# Patient Record
Sex: Female | Born: 1945 | Race: White | Hispanic: No | Marital: Married | Smoking: Never smoker
Health system: Southern US, Community
[De-identification: ages and names within clinical notes are randomized; demographics above are authoritative.]

## PROBLEM LIST (undated history)

## (undated) DIAGNOSIS — R011 Cardiac murmur, unspecified: Secondary | ICD-10-CM

## (undated) DIAGNOSIS — I493 Ventricular premature depolarization: Secondary | ICD-10-CM

## (undated) DIAGNOSIS — N816 Rectocele: Secondary | ICD-10-CM

## (undated) DIAGNOSIS — I10 Essential (primary) hypertension: Secondary | ICD-10-CM

## (undated) DIAGNOSIS — C801 Malignant (primary) neoplasm, unspecified: Secondary | ICD-10-CM

## (undated) HISTORY — DX: Malignant (primary) neoplasm, unspecified: C80.1

## (undated) HISTORY — PX: BREAST EXCISIONAL BIOPSY: SUR124

## (undated) HISTORY — PX: HERNIA REPAIR: SHX51

## (undated) HISTORY — PX: KNEE ARTHROSCOPY: SUR90

## (undated) HISTORY — PX: SKIN CANCER EXCISION: SHX779

## (undated) HISTORY — DX: Cardiac murmur, unspecified: R01.1

## (undated) HISTORY — DX: Rectocele: N81.6

## (undated) HISTORY — DX: Essential (primary) hypertension: I10

## (undated) HISTORY — PX: POLYPECTOMY: SHX149

## (undated) HISTORY — PX: COLONOSCOPY: SHX174

## (undated) HISTORY — PX: BREAST BIOPSY: SHX20

---

## 2002-12-10 HISTORY — PX: COLONOSCOPY W/ BIOPSIES AND POLYPECTOMY: SHX1376

## 2008-02-12 ENCOUNTER — Ambulatory Visit: Payer: Self-pay | Admitting: Vascular Surgery

## 2009-02-03 ENCOUNTER — Ambulatory Visit: Payer: Self-pay | Admitting: Vascular Surgery

## 2010-01-19 ENCOUNTER — Ambulatory Visit: Payer: Self-pay | Admitting: Vascular Surgery

## 2010-02-08 ENCOUNTER — Telehealth: Payer: Self-pay | Admitting: Cardiology

## 2010-02-13 ENCOUNTER — Encounter: Payer: Self-pay | Admitting: Cardiology

## 2010-03-14 ENCOUNTER — Encounter: Payer: Self-pay | Admitting: Internal Medicine

## 2010-03-21 ENCOUNTER — Ambulatory Visit: Payer: Self-pay | Admitting: Cardiology

## 2010-03-21 ENCOUNTER — Encounter: Payer: Self-pay | Admitting: Cardiology

## 2010-03-21 ENCOUNTER — Ambulatory Visit: Payer: Self-pay

## 2010-03-21 ENCOUNTER — Ambulatory Visit (HOSPITAL_COMMUNITY): Admission: RE | Admit: 2010-03-21 | Discharge: 2010-03-21 | Payer: Self-pay | Admitting: Cardiology

## 2010-03-21 DIAGNOSIS — R011 Cardiac murmur, unspecified: Secondary | ICD-10-CM | POA: Insufficient documentation

## 2010-03-21 DIAGNOSIS — I10 Essential (primary) hypertension: Secondary | ICD-10-CM

## 2010-04-19 ENCOUNTER — Telehealth: Payer: Self-pay | Admitting: Internal Medicine

## 2010-05-23 ENCOUNTER — Ambulatory Visit: Payer: Self-pay | Admitting: Internal Medicine

## 2011-01-09 NOTE — Progress Notes (Signed)
Summary: wants to see dr Daleen Squibb  Phone Note From Other Clinic   Summary of Call: Per Darl Pikes pt is wife of Dr Ladene Artist and wants to see Dr Daleen Squibb. Mild Htn with SVT. [phone] x [phone]  Initial call taken by: Edman Circle,  February 08, 2010 2:03 PM  Follow-up for Phone Call        I will be happy to see. Would Reids be quicker in Follow-up by: Gaylord Shih, MD, [facility],  February 09, 2010 10:50 AM     Appended Document: wants to see dr Daleen Squibb I scheduled her for 3/22 here in [place]. LM with Darl Pikes with appt date and time.

## 2011-01-09 NOTE — Progress Notes (Signed)
Summary: new referral :dr.Reina Wilton is not acceptance new patient  Phone Note Call from Patient Call back at Home Phone [phone]   Caller: Patient Reason for Call: Talk to Nurse, Referral Details for Reason: dr. Cato Mulligan is not taken any new patient at this time. could he recommend someone else.  Summary of Call: Pt is a wife of Dr. Coral Else and was referred by Dr. Daleen Squibb, please advise if you can accept or recommend another internist. Initial call taken by: Lorne Skeens,  Apr 19, 2010 12:27 PM  Follow-up for Phone Call        LEFT MESSAGE AT DR Posie Lillibridge OFFICE  THAT DR WALL REFERRED PT PLEASE LET DR Ashante Snelling KNOW AND CALL WITH RESPONSE. Follow-up by: Scherrie Bateman, LPN,  Apr 19, 2010 1:57 PM  Additional Follow-up for Phone Call Additional follow up Details #1::        ok to see me---no urgency if needs more urgent attn dr. Kirtland Bouchard, dr burchette Additional Follow-up by: Birdie Sons MD,  Apr 20, 2010 4:53 PM    Additional Follow-up for Phone Call Additional follow up Details #2::    Left message to call back and schedule appt. Follow-up by: Trixie Dredge,  Apr 21, 2010 2:12 PM

## 2011-01-09 NOTE — Assessment & Plan Note (Signed)
Summary: np6/HTN w/SVT/jml   Visit Type:  new pt visit Referring Provider:  Dr. Tawanna Sat Primary Provider:  Jonelle Sports  CC:  pt is changing cardiololgist..previous cardio Dr. Hyacinth Meeker in [place] and [place]....pt said she thought she had some SVT back in 96...denies any cp or edema or sob.  History of Present Illness: Ruth Shields comes in today to assess whether or not she needs to continue treatment for hypertension.  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 Azalia Bilis, who comes today for the above reason. She was started on antihypertensive years ago when she was taking Imitrex for migraines. At that time she was  overweight and wasn't exercising on a regular basis.  Her blood pressure is always running about 110/70.  She denies any orthopnea, PND or peripheral edema. He's had no chest pain.  She is very conscious on diet with salt restriction. She exercises 5 days a week. She has never smoked. She is no longer on Imitrex.  She does not have a primary care physician. She does see Dr. Adalberto Ill for OB/GYN.  She had blood work drawn April 5 which shows a normal CBC, normal urinalysis with no protein, cholesterol 197 who is present 37 HDL 80 and LDL 110, normal electrolytes, normal vitamin D and normal TSH.  Preventive Screening-Counseling & Management  Alcohol-Tobacco     Smoking Status: never  Caffeine-Diet-Exercise     Does Patient Exercise: yes      Drug Use:  no.    Current Medications (verified): 1)  Diovan Hct 80-12.5 Mg Tabs (Valsartan-Hydrochlorothiazide) .Marland Kitchen.. 1 Tab Once Daily 2)  Prometrium 100 Mg Caps (Progesterone Micronized) .Marland Kitchen.. 1 Cap At Bedtime 3)  Vivelle-Dot 0.0375 Mg/24hr Pttw (Estradiol) .... Change Patch 2 X Weekly 4)  Vitamin D (Ergocalciferol) 50000 Unit Caps (Ergocalciferol) .Marland Kitchen.. 1 Tab Weekly  Allergies (verified): No Known Drug Allergies  Past History:  Family History: Last updated: 03/21/2010 Father: Alive and  well Mother: Alive and well Siblings: Alive and well  Social History: Last updated: 03/21/2010 Full Time Married  Tobacco Use - No.  Alcohol Use - yes Regular Exercise - yes Drug Use - no  Risk Factors: Exercise: yes (03/21/2010)  Risk Factors: Smoking Status: never (03/21/2010)  Past Medical History: HYPERTENSION (ICD-401.9)  Past Surgical History: Knee Arthroscopy Left Hernia repair breast bx x2 skin ca removal  Family History: Father: Alive and well Mother: Alive and well Siblings: Alive and well  Social History: Full Time Married  Tobacco Use - No.  Alcohol Use - yes Regular Exercise - yes Drug Use - no Smoking Status:  never Does Patient Exercise:  yes Drug Use:  no  Review of Systems       negative other than history of present illness  Vital Signs:  Patient profile:   65 year old female Height:      65 inches Weight:      138 pounds BMI:     23.05 Pulse rate:   71 / minute Pulse rhythm:   irregular BP sitting:   110 / 60  (left arm) Cuff size:   large  Vitals Entered By: Danielle Rankin, CMA (March 21, 2010 3:19 PM)  Physical Exam  General:  Well developed, well nourished, in no acute distress. Head:  normocephalic and atraumatic Eyes:  PERRLA/EOM intact; conjunctiva and lids normal. Mouth:  Teeth, gums and palate normal. Oral mucosa normal. Neck:  Neck supple, no JVD. No masses, thyromegaly or abnormal cervical nodes. Chest  Ruth Shields:  no deformities or breast masses noted Lungs:  Clear bilaterally to auscultation and percussion. Heart:  PMI nondisplaced, normal S1-S2, split S1 versus click, doubt gallop, regular rate and rhythm, soft systolic murmur at the apex. Abdomen:  Bowel sounds positive; abdomen soft and non-tender without masses, organomegaly, or hernias noted. No hepatosplenomegaly. Msk:  Back normal, normal gait. Muscle strength and tone normal. Pulses:  pulses normal in all 4 extremities Extremities:  No clubbing or  cyanosis. Neurologic:  Alert and oriented x 3. Skin:  Intact without lesions or rashes. Psych:  Normal affect.   Problems:  Medical Problems Added: 1)  Dx of Murmur  (ICD-785.2) 2)  Dx of Hypertension  (ICD-401.9)  EKG  Procedure date:  03/21/2010  Findings:      normal sinus rhythm with some PACs, poor R. progression and anterior precordium, probably normal  Impression & Recommendations:  Problem # 1:  HYPERTENSION (ICD-401.9) I suspect she does not need antihypertensive with her current healthy lifestyle not to mention weight loss. She is no longer need vasoconstrictors.  I advised her to stop her Diovan HCTZ to check her pressure several times a week at rest. She'll check in with a large adult cuff. Goal is 130 or less over 80 or less. If his above this she'll start back on her medication. We will also check a 2-D echocardiogram to assess for LVH and the possibility of prolapse. She would like to obtain a internist for her and have recommended Dr. Cato Mulligan. I'll see her back p.r.n. Her updated medication list for this problem includes:    Diovan Hct 80-12.5 Mg Tabs (Valsartan-hydrochlorothiazide) .Marland Kitchen... 1 tab once daily  Orders: EKG w/ Interpretation (93000) Echocardiogram (Echo)  Problem # 2:  MURMUR (ICD-785.2) Assessment: New This is most likely secondary to a flow murmur. We will rule out prolapse. Her updated medication list for this problem includes:    Diovan Hct 80-12.5 Mg Tabs (Valsartan-hydrochlorothiazide) .Marland Kitchen... 1 tab once daily Preliminary echocardiogram is normal. There is no LVH and mitral valve prolapse. Official report to follow and we will call patient.  Patient Instructions: 1)  Your physician recommends that you schedule a follow-up appointment in: AS NEEDED 2)  Your physician has requested that you have an echocardiogram.  Echocardiography is a painless test that uses sound waves to create images of your heart. It provides your doctor with information  about the size and shape of your heart and how well your heart's chambers and valves are working.  This procedure takes approximately one hour. There are no restrictions for this procedure. 3)  Your physician has recommended you make the following change in your medication: STOP  DIOVAN

## 2011-01-09 NOTE — Letter (Signed)
Summary: [facility]  [facility]   Imported By: Marylou Mccoy 05/17/2010 13:40:26  _____________________________________________________________________  External Attachment:    Type:   Image     Comment:   External Document

## 2011-01-09 NOTE — Assessment & Plan Note (Signed)
Summary: NEW PT TO EST OK PER MD//SLM   Vital Signs:  Patient profile:   65 year old female Menstrual status:  postmenopausal Height:      64.5 inches Weight:      139 pounds BMI:     23.58 Pulse rate:   64 / minute Pulse rhythm:   regular Resp:     12 per minute BP sitting:   92 / 60  (left arm) Cuff size:   regular  Vitals Entered By: Gladis Riffle, RN (May 23, 2010 10:20 AM) CC: to establish, referred by Dr Daleen Squibb Is Patient Diabetic? No     Menstrual Status postmenopausal Last PAP Result normal per pt   Primary Care Provider:  Jonelle Sports  CC:  to establish and referred by Dr Daleen Squibb.  History of Present Illness: SVT---no recurrence---reviewed Dr. Epifania Gore note  HTN---tolerating meds without difficulty (duration 4 years)    Preventive Screening-Counseling & Management  Alcohol-Tobacco     Smoking Status: never  Current Problems (verified): 1)  Murmur  (ICD-785.2) 2)  Hypertension  (ICD-401.9)  Current Medications (verified): 1)  Diovan Hct 80-12.5 Mg Tabs (Valsartan-Hydrochlorothiazide) .Marland Kitchen.. 1 Tab Once Daily 2)  Prometrium 100 Mg Caps (Progesterone Micronized) .Marland Kitchen.. 1 Cap At Bedtime 3)  Vivelle-Dot 0.0375 Mg/24hr Pttw (Estradiol) .... Change Patch 2 X Weekly 4)  Vitamin D (Ergocalciferol) 50000 Unit Caps (Ergocalciferol) .Marland Kitchen.. 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  Alcohol Use - yes one glass/night Regular Exercise - yes Drug Use - no  Physical Exam  General:  Well-developed,well-nourished,in no acute distress; alert,appropriate and cooperative throughout examination Head:  normocephalic and atraumatic.   Eyes:  pupils equal and pupils round.   Ears:  R ear normal and L ear normal.   Neck:  Neck supple, no JVD. No masses, thyromegaly or abnormal cervical nodes. Chest Wall:  no deformities or breast masses noted Lungs:  Clear bilaterally  to auscultation and percussion. Heart:  normal rate and regular rhythm.   Abdomen:  Bowel sounds positive; abdomen soft and non-tender without masses, organomegaly, or hernias noted. No hepatosplenomegaly. Msk:  No deformity or scoliosis noted of thoracic or lumbar spine.   Neurologic:  cranial nerves II-XII intact and gait normal.     Impression & Recommendations:  Problem # 1:  HYPERTENSION (ICD-401.9)  Her updated medication list for this problem includes:    Diovan Hct 80-12.5 Mg Tabs (Valsartan-hydrochlorothiazide) .Marland Kitchen... 1 tab once daily  BP today: 92/60 Prior BP: 110/60 (03/21/2010)  Complete Medication List: 1)  Diovan Hct 80-12.5 Mg Tabs (Valsartan-hydrochlorothiazide) .Marland Kitchen.. 1 tab once daily 2)  Prometrium 100 Mg Caps (Progesterone micronized) .Marland Kitchen.. 1 cap at bedtime 3)  Vivelle-dot 0.0375 Mg/24hr Pttw (Estradiol) .... Change patch 2 x weekly 4)  Vitamin D (ergocalciferol) 50000 Unit Caps (Ergocalciferol) .Marland Kitchen.. 1 tab weekly  Preventive Care Screening  Colonoscopy:    Date:  12/10/2001    Next Due:  12/2011    Results:  small polyp per pt   Mammogram:    Date:  12/10/2009    Next Due:  06/2010    Results:  normal per pt   Pap Smear:    Date:  03/10/2010    Next Due:  03/2011    Results:  normal per pt   Last Tetanus Booster:    Date:  12/10/2008    Results:  Historical   Prescriptions: DIOVAN HCT 80-12.5  MG TABS (VALSARTAN-HYDROCHLOROTHIAZIDE) 1 tab once daily  #90 x 3   Entered and Authorized by:   Birdie Sons MD   Signed by:   Birdie Sons MD on 05/23/2010   Method used:   Faxed to ...       [facility])             , [place]         Ph: [phone]       Fax: [phone]   RxID:   [id number]    Immunization History:  Tetanus/Td Immunization History:    Tetanus/Td:  historical (12/10/2008)    Preventive Care Screening  Colonoscopy:    Date:  12/10/2001    Next Due:  12/2011    Results:  small polyp per pt   Mammogram:     Date:  12/10/2009    Next Due:  06/2010    Results:  normal per pt   Pap Smear:    Date:  03/10/2010    Next Due:  03/2011    Results:  normal per pt   Last Tetanus Booster:    Date:  12/10/2008    Results:  Historical

## 2011-01-11 ENCOUNTER — Ambulatory Visit (INDEPENDENT_AMBULATORY_CARE_PROVIDER_SITE_OTHER)

## 2011-01-11 ENCOUNTER — Ambulatory Visit: Admit: 2011-01-11 | Payer: Self-pay | Admitting: Vascular Surgery

## 2011-01-11 DIAGNOSIS — I83893 Varicose veins of bilateral lower extremities with other complications: Secondary | ICD-10-CM

## 2011-05-16 ENCOUNTER — Other Ambulatory Visit: Payer: Self-pay | Admitting: Internal Medicine

## 2011-05-16 DIAGNOSIS — I10 Essential (primary) hypertension: Secondary | ICD-10-CM

## 2011-06-06 ENCOUNTER — Encounter: Admitting: Internal Medicine

## 2011-07-11 ENCOUNTER — Encounter: Payer: Self-pay | Admitting: Internal Medicine

## 2011-07-24 ENCOUNTER — Encounter: Admitting: Internal Medicine

## 2011-08-21 ENCOUNTER — Other Ambulatory Visit: Payer: Self-pay | Admitting: Internal Medicine

## 2011-11-17 ENCOUNTER — Other Ambulatory Visit: Payer: Self-pay | Admitting: Internal Medicine

## 2011-11-30 ENCOUNTER — Other Ambulatory Visit: Payer: Self-pay | Admitting: Internal Medicine

## 2011-12-11 LAB — HM PAP SMEAR: HM Pap smear: NORMAL

## 2011-12-12 ENCOUNTER — Telehealth: Payer: Self-pay | Admitting: Internal Medicine

## 2011-12-12 NOTE — Telephone Encounter (Signed)
Opened in error

## 2012-01-09 ENCOUNTER — Encounter: Payer: Self-pay | Admitting: Internal Medicine

## 2012-01-09 ENCOUNTER — Ambulatory Visit (INDEPENDENT_AMBULATORY_CARE_PROVIDER_SITE_OTHER): Admitting: Internal Medicine

## 2012-01-09 DIAGNOSIS — R011 Cardiac murmur, unspecified: Secondary | ICD-10-CM

## 2012-01-09 DIAGNOSIS — Z0000 Encounter for general adult medical examination without abnormal findings: Secondary | ICD-10-CM

## 2012-01-09 DIAGNOSIS — I10 Essential (primary) hypertension: Secondary | ICD-10-CM

## 2012-01-09 MED ORDER — VALSARTAN-HYDROCHLOROTHIAZIDE 80-12.5 MG PO TABS: 1.0000 | ORAL_TABLET | Freq: Every day | ORAL | Status: DC

## 2012-01-09 NOTE — Progress Notes (Signed)
Patient ID: Ruth Shields, female   DOB: 07/09/1946, 66 y.o.   MRN: [id number] cpx  Past Medical History  Diagnosis Date  . Hypertension     History   Social History  . Marital Status: Married    Spouse Name: N/A    Number of Children: N/A  . Years of Education: N/A   Occupational History  . Not on file.   Social History Main Topics  . Smoking status: Never Smoker   . Smokeless tobacco: Not on file  . Alcohol Use: 0.5 oz/week    1 drink(s) per week  . Drug Use: No  . Sexually Active: Not on file   Other Topics Concern  . Not on file   Social History Narrative  . No narrative on file    Past Surgical History  Procedure Date  . Knee arthroscopy   . Hernia repair     left  . Breast biopsy     x2  . Skin cancer excision     Family History  Problem Relation Age of Onset  . Atrial fibrillation Mother   . Cancer Father     lymphoma  . Hypertension Father     No Known Allergies  Current Outpatient Prescriptions on File Prior to Visit  Medication Sig Dispense Refill  . DIOVAN HCT 80-12.5 MG per tablet TAKE 1 TABLET ONCE DAILY  90 tablet  0     patient denies chest pain, shortness of breath, orthopnea. Denies lower extremity edema, abdominal pain, change in appetite, change in bowel movements. Patient denies rashes, musculoskeletal complaints. No other specific complaints in a complete review of systems.   BP 122/74  Pulse 72  Temp(Src) 98.2 F (36.8 C) (Oral)  Ht 5' 4.25" (1.632 m)  Wt 142 lb (64.411 kg)  BMI 24.19 kg/m2  Well-developed well-nourished female in no acute distress. HEENT exam atraumatic, normocephalic, extraocular muscles are intact. Neck is supple. No jugular venous distention no thyromegaly. Chest clear to auscultation without increased work of breathing. Cardiac exam S1 and S2 are regular. Abdominal exam active bowel sounds, soft, nontender. Extremities no edema. Neurologic exam she is alert without any motor sensory deficits. Gait is  normal.  A/P: Well visit--health Maint utd.

## 2012-01-09 NOTE — Patient Instructions (Signed)
Sign medical release form---colonoscopy

## 2012-01-11 ENCOUNTER — Other Ambulatory Visit: Payer: Self-pay | Admitting: Internal Medicine

## 2012-01-11 DIAGNOSIS — Z0000 Encounter for general adult medical examination without abnormal findings: Secondary | ICD-10-CM

## 2012-03-06 ENCOUNTER — Encounter: Payer: Self-pay | Admitting: Gastroenterology

## 2012-04-17 ENCOUNTER — Encounter

## 2012-04-24 ENCOUNTER — Ambulatory Visit (AMBULATORY_SURGERY_CENTER): Admitting: *Deleted

## 2012-04-24 VITALS — Ht 64.75 in | Wt 143.4 lb

## 2012-04-24 DIAGNOSIS — Z1211 Encounter for screening for malignant neoplasm of colon: Secondary | ICD-10-CM

## 2012-04-24 MED ORDER — PEG-KCL-NACL-NASULF-NA ASC-C 100 G PO SOLR: ORAL | Status: DC

## 2012-05-02 ENCOUNTER — Other Ambulatory Visit: Admitting: Gastroenterology

## 2012-05-06 ENCOUNTER — Telehealth: Payer: Self-pay | Admitting: Gastroenterology

## 2012-05-06 NOTE — Telephone Encounter (Signed)
Pt's pharmacy filled MoviPrep as PEG 3350 prep.  I talked to pharmacy and pt can bring prep back and get MoviPrep as prescribed.  Pt notified.  Ezra Sites

## 2012-05-09 ENCOUNTER — Encounter: Payer: Self-pay | Admitting: Gastroenterology

## 2012-05-09 ENCOUNTER — Ambulatory Visit (AMBULATORY_SURGERY_CENTER): Admitting: Gastroenterology

## 2012-05-09 VITALS — BP 114/75 | HR 66 | Temp 96.1°F | Resp 19 | Ht 64.0 in | Wt 143.0 lb

## 2012-05-09 DIAGNOSIS — D126 Benign neoplasm of colon, unspecified: Secondary | ICD-10-CM

## 2012-05-09 DIAGNOSIS — Z1211 Encounter for screening for malignant neoplasm of colon: Secondary | ICD-10-CM

## 2012-05-09 MED ORDER — SODIUM CHLORIDE 0.9 % IV SOLN: 500.0000 mL | INTRAVENOUS | Status: DC

## 2012-05-09 NOTE — Progress Notes (Signed)
Patient did not experience any of the following events: a burn prior to discharge; a fall within the facility; wrong site/side/patient/procedure/implant event; or a hospital transfer or hospital admission upon discharge from the facility. (G8907) Patient did not have preoperative order for IV antibiotic SSI prophylaxis. (G8918)  

## 2012-05-09 NOTE — Op Note (Signed)
[facility] [address] [place], [place]  [address]  COLONOSCOPY PROCEDURE REPORT PATIENT:  Ruth Shields, Ruth Shields  MR#:  [id number] BIRTHDATE:  Oct 15, 1946, 65 yrs. old  GENDER:  female ENDOSCOPIST:  Judie Petit T. Russella Dar, MD, [facility]  PROCEDURE DATE:  05/09/2012 PROCEDURE:  Colonoscopy with biopsy and snare polypectomy ASA CLASS:  Class II INDICATIONS:  1) Routine Risk Screening MEDICATIONS:   Fentanyl 100 mcg IV, Versed 10 mg IV, Benadryl 12.5 mg IV, These medications were titrated to patient response per physician's verbal order DESCRIPTION OF PROCEDURE:   After the risks benefits and alternatives of the procedure were thoroughly explained, informed consent was obtained.  Digital rectal exam was performed and revealed no abnormalities.   The LB [id number] endoscope was introduced through the anus and advanced to the cecum, which was identified by both the appendix and ileocecal valve, with a tortuous colon.   The quality of the prep was good, using MoviPrep.  The instrument was then slowly withdrawn as the colon was fully examined. <<PROCEDUREIMAGES>> FINDINGS:  A sessile polyp was found in the proximal transverse colon. It was 4 mm in size. The polyp was removed using cold biopsy forceps.  A sessile polyp was found in the sigmoid colon. It was 5 mm in size. Polyp was snared without cautery. Retrieval was successful. 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 Otherwise normal colonoscopy without other polyps, masses, vascular ectasias, or inflammatory changes.   Retroflexed views in the rectum revealed no abnormalities.  The time to cecum =  5.75 minutes. The scope was then withdrawn (time =  12  min) from the patient and the procedure completed.  COMPLICATIONS:  None  ENDOSCOPIC IMPRESSION: 1) 4 mm sessile polyp in the proximal transverse colon 2) 5 mm sessile polyp in the sigmoid colon 3) Melanosis throughout the colon  RECOMMENDATIONS: 1) Await pathology  results 2) Repeat Colonoscopy in 5 years if polyp is adenomatous, otherwise 10 years for routine screening.  Venita Lick. Russella Dar, MD, [facility]  n. eSIGNED:   Venita Lick. Christyann Manolis at 05/09/2012 10:03 AM  Wendee Beavers, [phone]

## 2012-05-09 NOTE — Patient Instructions (Signed)
YOU HAD AN ENDOSCOPIC PROCEDURE TODAY AT THE [facility]: Refer to the procedure report that was given to you for any specific questions about what was found during the examination.  If the procedure report does not answer your questions, please call your gastroenterologist to clarify.  If you requested that your care partner not be given the details of your procedure findings, then the procedure report has been included in a sealed envelope for you to review at your convenience later.  YOU SHOULD EXPECT: Some feelings of bloating in the abdomen. Passage of more gas than usual.  Walking can help get rid of the air that was put into your GI tract during the procedure and reduce the bloating. If you had a lower endoscopy (such as a colonoscopy or flexible sigmoidoscopy) you may notice spotting of blood in your stool or on the toilet paper. If you underwent a bowel prep for your procedure, then you may not have a normal bowel movement for a few days.  DIET: Your first meal following the procedure should be a light meal and then it is ok to progress to your normal diet.  A half-sandwich or bowl of soup is an example of a good first meal.  Heavy or fried foods are harder to digest and may make you feel nauseous or bloated.  Likewise meals heavy in dairy and vegetables can cause extra gas to form and this can also increase the bloating.  Drink plenty of fluids but you should avoid alcoholic beverages for 24 hours.  ACTIVITY: Your care partner should take you home directly after the procedure.  You should plan to take it easy, moving slowly for the rest of the day.  You can resume normal activity the day after the procedure however you should NOT DRIVE or use heavy machinery for 24 hours (because of the sedation medicines used during the test).    SYMPTOMS TO REPORT IMMEDIATELY: A gastroenterologist can be reached at any hour.  During normal business hours, 8:30 AM to 5:00 PM Monday through Friday,  call [phone].  After hours and on weekends, please call the GI answering service at [phone] who will take a message and have the physician on call contact you.   Following lower endoscopy (colonoscopy or flexible sigmoidoscopy):  Excessive amounts of blood in the stool  Significant tenderness or worsening of abdominal pains  Swelling of the abdomen that is new, acute  Fever of 100F or higher    FOLLOW UP: If any biopsies were taken you will be contacted by phone or by letter within the next 1-3 weeks.  Call your gastroenterologist if you have not heard about the biopsies in 3 weeks.  Our staff will call the home number listed on your records the next business day following your procedure to check on you and address any questions or concerns that you may have at that time regarding the information given to you following your procedure. This is a courtesy call and so if there is no answer at the home number and we have not heard from you through the emergency physician on call, we will assume that you have returned to your regular daily activities without incident.  SIGNATURES/CONFIDENTIALITY: You and/or your care partner have signed paperwork which will be entered into your electronic medical record.  These signatures attest to the fact that that the information above on your After Visit Summary has been reviewed and is understood.  Full responsibility of the confidentiality   of this discharge information lies with you and/or your care-partner.     

## 2012-05-13 ENCOUNTER — Telehealth: Payer: Self-pay

## 2012-05-13 ENCOUNTER — Encounter: Payer: Self-pay | Admitting: Gastroenterology

## 2012-05-13 MED ORDER — ERGOCALCIFEROL 1.25 MG (50000 UT) PO CAPS: 50000.0000 [IU] | ORAL_CAPSULE | ORAL | Status: AC

## 2012-05-13 MED ORDER — ERGOCALCIFEROL 1.25 MG (50000 UT) PO CAPS: 50000.0000 [IU] | ORAL_CAPSULE | ORAL | Status: DC

## 2012-05-13 NOTE — Telephone Encounter (Signed)
Call patient. Vitamin D is low.  Start vitamin D [address] iu weekly for 12 weeks. Check vitamin d level in 16 weeks

## 2012-05-13 NOTE — Telephone Encounter (Signed)
Pt notified and rx sent to [facility]

## 2012-05-13 NOTE — Telephone Encounter (Signed)
Pt would like to know if Dr. Cato Mulligan has reviewed her labs from [facility], specifically her low Vit D level.  Labs have been scanned in.

## 2012-07-17 ENCOUNTER — Encounter: Payer: Self-pay | Admitting: Internal Medicine

## 2012-08-07 ENCOUNTER — Other Ambulatory Visit: Payer: Self-pay | Admitting: Internal Medicine

## 2012-08-07 NOTE — Telephone Encounter (Signed)
Does pt need to continue this or can she go OTC now?

## 2012-11-26 ENCOUNTER — Ambulatory Visit: Admitting: *Deleted

## 2012-11-28 ENCOUNTER — Other Ambulatory Visit: Payer: Self-pay | Admitting: Internal Medicine

## 2012-12-30 ENCOUNTER — Other Ambulatory Visit: Payer: Self-pay | Admitting: Dermatology

## 2013-03-31 ENCOUNTER — Other Ambulatory Visit: Payer: Self-pay | Admitting: Obstetrics and Gynecology

## 2013-03-31 DIAGNOSIS — N6311 Unspecified lump in the right breast, upper outer quadrant: Secondary | ICD-10-CM

## 2013-04-07 ENCOUNTER — Encounter: Payer: Self-pay | Admitting: Internal Medicine

## 2013-04-07 ENCOUNTER — Ambulatory Visit (INDEPENDENT_AMBULATORY_CARE_PROVIDER_SITE_OTHER): Admitting: Internal Medicine

## 2013-04-07 VITALS — BP 110/82 | HR 76 | Temp 97.9°F | Ht 65.0 in | Wt 137.0 lb

## 2013-04-07 DIAGNOSIS — I10 Essential (primary) hypertension: Secondary | ICD-10-CM

## 2013-04-07 MED ORDER — VALSARTAN-HYDROCHLOROTHIAZIDE 80-12.5 MG PO TABS: 1.0000 | ORAL_TABLET | Freq: Every day | ORAL | Status: DC

## 2013-04-07 MED ORDER — VALSARTAN-HYDROCHLOROTHIAZIDE 80-12.5 MG PO TABS: 90.0000 | ORAL_TABLET | Freq: Every day | ORAL | Status: DC

## 2013-04-07 MED ORDER — VITAMIN D 50 MCG (2000 UT) PO CAPS: 1.0000 | ORAL_CAPSULE | Freq: Every day | ORAL | Status: DC

## 2013-04-09 NOTE — Assessment & Plan Note (Signed)
Well controlled  reviewed labs No further eval necessary

## 2013-04-09 NOTE — Progress Notes (Signed)
Patient ID: Darion Juhasz, female   DOB: Sep 13, 1946, 67 y.o.   MRN: [id number] htn- tolerating meds  She is 67 yo and takes remarkably good care of herself  Reviewed pmh, psh, sochx  She brought in labs for my review   patient denies chest pain, shortness of breath, orthopnea. Denies lower extremity edema, abdominal pain, change in appetite, change in bowel movements. Patient denies rashes, musculoskeletal complaints. No other specific complaints in a complete review of systems.     Well-developed remarkably healthy apperaring female in no acute distress.she looks 29 years younger than her stated age.  HEENT exam atraumatic, normocephalic, extraocular muscles are intact. Neck is supple. No jugular venous distention no thyromegaly. Chest clear to auscultation without increased work of breathing. Cardiac exam S1 and S2 are regular. Abdominal exam active bowel sounds, soft, nontender. Extremities no edema. Neurologic exam she is alert without any motor sensory deficits. Gait is normal.

## 2013-04-13 ENCOUNTER — Ambulatory Visit
Admission: RE | Admit: 2013-04-13 | Discharge: 2013-04-13 | Disposition: A | Discharge status: Home / Self Care | Source: Ambulatory Visit | Attending: Obstetrics and Gynecology | Admitting: Obstetrics and Gynecology

## 2013-04-13 DIAGNOSIS — N6311 Unspecified lump in the right breast, upper outer quadrant: Secondary | ICD-10-CM

## 2013-04-28 ENCOUNTER — Other Ambulatory Visit: Payer: Self-pay | Admitting: Obstetrics and Gynecology

## 2013-05-28 ENCOUNTER — Other Ambulatory Visit: Payer: Self-pay

## 2013-05-28 DIAGNOSIS — Z1231 Encounter for screening mammogram for malignant neoplasm of breast: Secondary | ICD-10-CM

## 2013-07-07 ENCOUNTER — Ambulatory Visit
Admission: RE | Admit: 2013-07-07 | Discharge: 2013-07-07 | Disposition: A | Discharge status: Home / Self Care | Source: Ambulatory Visit

## 2013-07-07 DIAGNOSIS — Z1231 Encounter for screening mammogram for malignant neoplasm of breast: Secondary | ICD-10-CM

## 2013-11-19 ENCOUNTER — Other Ambulatory Visit: Payer: Self-pay | Admitting: Dermatology

## 2013-11-19 DIAGNOSIS — L570 Actinic keratosis: Secondary | ICD-10-CM | POA: Diagnosis not present

## 2014-03-02 ENCOUNTER — Other Ambulatory Visit: Payer: Self-pay | Admitting: Internal Medicine

## 2014-04-26 ENCOUNTER — Telehealth: Payer: Self-pay | Admitting: Internal Medicine

## 2014-04-26 NOTE — Telephone Encounter (Signed)
Pt is needing new rx valsartan-hydrochlorothiazide (DIOVAN-HCT) 80-12.5 MG per tablet, [facility].

## 2014-04-28 DIAGNOSIS — K219 Gastro-esophageal reflux disease without esophagitis: Secondary | ICD-10-CM | POA: Diagnosis not present

## 2014-04-28 DIAGNOSIS — E559 Vitamin D deficiency, unspecified: Secondary | ICD-10-CM | POA: Diagnosis not present

## 2014-04-28 DIAGNOSIS — Z23 Encounter for immunization: Secondary | ICD-10-CM | POA: Diagnosis not present

## 2014-04-28 DIAGNOSIS — I10 Essential (primary) hypertension: Secondary | ICD-10-CM | POA: Diagnosis not present

## 2014-04-28 NOTE — Telephone Encounter (Signed)
Pt needs an appt

## 2014-04-29 DIAGNOSIS — H5200 Hypermetropia, unspecified eye: Secondary | ICD-10-CM | POA: Diagnosis not present

## 2014-04-29 DIAGNOSIS — H524 Presbyopia: Secondary | ICD-10-CM | POA: Diagnosis not present

## 2014-04-29 DIAGNOSIS — H35369 Drusen (degenerative) of macula, unspecified eye: Secondary | ICD-10-CM | POA: Diagnosis not present

## 2014-04-29 LAB — BASIC METABOLIC PANEL
BUN: 10 mg/dL (ref 4–21)
Creatinine: 0.7 mg/dL (ref <=1.1)
GLUCOSE: 85 mg/dL
POTASSIUM: 4.1 mmol/L (ref 3.4–5.3)
SODIUM: 140 mmol/L (ref 137–147)

## 2014-04-29 LAB — HEPATIC FUNCTION PANEL: AST: 24 U/L (ref 13–35)

## 2014-04-29 LAB — CBC AND DIFFERENTIAL
HEMATOCRIT: 38 % (ref 36–46)
Hemoglobin: 12.6 g/dL (ref 12.0–16.0)
Neutrophils Absolute: 2 /uL
Platelets: 247 10*3/uL (ref 150–399)
WBC: 4 10^3/mL

## 2014-04-29 LAB — LIPID PANEL
Cholesterol: 225 mg/dL — AB (ref 0–200)
HDL: 101 mg/dL — AB (ref 35–70)
LDL CALC: 116 mg/dL
TRIGLYCERIDES: 42 mg/dL (ref 40–160)

## 2014-04-29 LAB — TSH: TSH: 1.1 u[IU]/mL (ref <=5.90)

## 2014-05-07 LAB — CK: CPK 2 (MB): 113

## 2014-05-07 LAB — VITAMIN D 25 HYDROXY (VIT D DEFICIENCY, FRACTURES): VIT D 25 HYDROXY: 53.7

## 2014-05-07 NOTE — Telephone Encounter (Signed)
appt made for pt

## 2014-05-13 ENCOUNTER — Other Ambulatory Visit: Payer: Self-pay | Admitting: Internal Medicine

## 2014-05-24 ENCOUNTER — Encounter: Payer: Self-pay | Admitting: Family

## 2014-05-24 ENCOUNTER — Ambulatory Visit (INDEPENDENT_AMBULATORY_CARE_PROVIDER_SITE_OTHER): Payer: Medicare Other | Admitting: Family

## 2014-05-24 ENCOUNTER — Telehealth: Payer: Self-pay | Admitting: Internal Medicine

## 2014-05-24 VITALS — BP 110/70 | HR 76 | Temp 98.4°F | Ht 65.0 in | Wt 136.0 lb

## 2014-05-24 DIAGNOSIS — I10 Essential (primary) hypertension: Secondary | ICD-10-CM | POA: Diagnosis not present

## 2014-05-24 MED ORDER — VALSARTAN-HYDROCHLOROTHIAZIDE 80-12.5 MG PO TABS: ORAL_TABLET | ORAL | Status: DC

## 2014-05-24 NOTE — Progress Notes (Signed)
Pre visit review using our clinic review tool, if applicable. No additional management support is needed unless otherwise documented below in the visit note. 

## 2014-05-24 NOTE — Patient Instructions (Signed)

## 2014-05-24 NOTE — Telephone Encounter (Signed)
Relevant patient education mailed to patient.  

## 2014-05-24 NOTE — Progress Notes (Signed)
Subjective:    Patient ID: Ruth Shields, female    DOB: 01-Mar-1946, 68 y.o.   MRN: [id number]  HPI 68 year old white female, nonsmoker is in today for recheck of hypertension. Reports are well. Denies any concerns.   Review of Systems  Constitutional: Negative.   HENT: Negative.   Respiratory: Negative.   Cardiovascular: Negative.   Gastrointestinal: Negative.   Endocrine: Negative.   Genitourinary: Negative.   Musculoskeletal: Negative.   Skin: Negative.   Allergic/Immunologic: Negative.   Neurological: Negative.   Hematological: Negative.   Psychiatric/Behavioral: Negative.    Past Medical History  Diagnosis Date  . Hypertension     History   Social History  . Marital Status: Married    Spouse Name: N/A    Number of Children: N/A  . Years of Education: N/A   Occupational History  . Not on file.   Social History Main Topics  . Smoking status: Never Smoker   . Smokeless tobacco: Never Used  . Alcohol Use: 0.5 oz/week    1 drink(s) per week  . Drug Use: No  . Sexual Activity: Not on file   Other Topics Concern  . Not on file   Social History Narrative  . No narrative on file    Past Surgical History  Procedure Laterality Date  . Knee arthroscopy    . Hernia repair      left  . Breast biopsy      x2  . Skin cancer excision    . Colonoscopy w/ biopsies and polypectomy  2004    [place]. Cristal [place], [place]     Family History  Problem Relation Age of Onset  . Atrial fibrillation Mother   . Cancer Father     lymphoma  . Hypertension Father   . Colon cancer Neg Hx   . Stomach cancer Neg Hx     No Known Allergies  Current Outpatient Prescriptions on File Prior to Visit  Medication Sig Dispense Refill  . Cholecalciferol (VITAMIN D) 2000 UNITS CAPS Take 1 capsule (2,000 Units total) by mouth daily.  30 capsule    . estradiol (VIVELLE-DOT) 0.0375 MG/24HR Place 1 patch onto the skin 2 (two) times a week.      . progesterone (PROMETRIUM) 100 MG  capsule Take 100 mg by mouth daily.      . diclofenac (VOLTAREN) 75 MG EC tablet Take 75 mg by mouth 2 (two) times daily as needed.        No current facility-administered medications on file prior to visit.    BP 110/70  Pulse 76  Temp(Src) 98.4 F (36.9 C) (Oral)  Ht 5\' 5"  (1.651 m)  Wt 136 lb (61.689 kg)  BMI 22.63 kg/m2chart    Objective:   Physical Exam  Constitutional: She is oriented to person, place, and time. She appears well-developed and well-nourished.  HENT:  Right Ear: External ear normal.  Left Ear: External ear normal.  Nose: Nose normal.  Mouth/Throat: Oropharynx is clear and moist.  Neck: Normal range of motion. Neck supple.  Cardiovascular: Normal rate, regular rhythm and normal heart sounds.   Pulmonary/Chest: Effort normal and breath sounds normal.  Abdominal: Soft. Bowel sounds are normal.  Musculoskeletal: Normal range of motion.  Neurological: She is alert and oriented to person, place, and time.  Skin: Skin is warm and dry.  Psychiatric: She has a normal mood and affect.          Assessment & Plan:   Problem List Items Addressed  This Visit   HYPERTENSION - Primary   Relevant Medications      valsartan-hydrochlorothiazide (DIOVAN-HCT) 80-12.5 MG per tablet     Continue current medications. Patient will have labs drawn in 6 months. Diovan renewed today. Continue exercising.

## 2014-05-25 ENCOUNTER — Other Ambulatory Visit: Payer: Self-pay

## 2014-05-25 DIAGNOSIS — Z1231 Encounter for screening mammogram for malignant neoplasm of breast: Secondary | ICD-10-CM

## 2014-07-15 ENCOUNTER — Ambulatory Visit: Payer: Medicare Other

## 2014-07-31 ENCOUNTER — Emergency Department (HOSPITAL_COMMUNITY): Payer: Medicare Other

## 2014-07-31 ENCOUNTER — Encounter (HOSPITAL_COMMUNITY): Payer: Self-pay | Admitting: Emergency Medicine

## 2014-07-31 ENCOUNTER — Emergency Department (HOSPITAL_COMMUNITY)
Admission: EM | Admit: 2014-07-31 | Discharge: 2014-07-31 | Disposition: A | Discharge status: Home / Self Care | Payer: Medicare Other | Attending: Emergency Medicine | Admitting: Emergency Medicine

## 2014-07-31 DIAGNOSIS — R002 Palpitations: Secondary | ICD-10-CM

## 2014-07-31 DIAGNOSIS — Z7901 Long term (current) use of anticoagulants: Secondary | ICD-10-CM | POA: Insufficient documentation

## 2014-07-31 DIAGNOSIS — Z79899 Other long term (current) drug therapy: Secondary | ICD-10-CM | POA: Diagnosis not present

## 2014-07-31 DIAGNOSIS — I499 Cardiac arrhythmia, unspecified: Secondary | ICD-10-CM | POA: Diagnosis not present

## 2014-07-31 DIAGNOSIS — R1011 Right upper quadrant pain: Secondary | ICD-10-CM | POA: Insufficient documentation

## 2014-07-31 DIAGNOSIS — R51 Headache: Secondary | ICD-10-CM | POA: Insufficient documentation

## 2014-07-31 DIAGNOSIS — I10 Essential (primary) hypertension: Secondary | ICD-10-CM | POA: Insufficient documentation

## 2014-07-31 DIAGNOSIS — R112 Nausea with vomiting, unspecified: Secondary | ICD-10-CM | POA: Diagnosis not present

## 2014-07-31 HISTORY — DX: Ventricular premature depolarization: I49.3

## 2014-07-31 LAB — CBC
HEMATOCRIT: 39.4 % (ref 36.0–46.0)
Hemoglobin: 13.6 g/dL (ref 12.0–15.0)
MCH: 31.6 pg (ref 26.0–34.0)
MCHC: 34.5 g/dL (ref 30.0–36.0)
MCV: 91.4 fL (ref 78.0–100.0)
PLATELETS: 230 10*3/uL (ref 150–400)
RBC: 4.31 MIL/uL (ref 3.87–5.11)
RDW: 13 % (ref 11.5–15.5)
WBC: 5.9 10*3/uL (ref 4.0–10.5)

## 2014-07-31 LAB — BASIC METABOLIC PANEL
Anion gap: 14 (ref 5–15)
BUN: 12 mg/dL (ref 6–23)
CALCIUM: 9.4 mg/dL (ref 8.4–10.5)
CO2: 27 mEq/L (ref 19–32)
CREATININE: 0.65 mg/dL (ref 0.50–1.10)
Chloride: 101 mEq/L (ref 96–112)
GFR calc non Af Amer: >90 mL/min (ref >90)
Glucose, Bld: 101 mg/dL — ABNORMAL HIGH (ref 70–99)
Potassium: 3.9 mEq/L (ref 3.7–5.3)
Sodium: 142 mEq/L (ref 137–147)

## 2014-07-31 LAB — I-STAT TROPONIN, ED: Troponin i, poc: 0 ng/mL (ref 0.00–0.08)

## 2014-07-31 NOTE — Discharge Instructions (Signed)
Return to the ED with any concerns including fainting, difficulty breathing, chest pain, leg swelling, decreased level of alertness/lethargy, or any other alarming symptoms °

## 2014-07-31 NOTE — ED Provider Notes (Signed)
CSN: [id number]     Arrival date & time 07/31/14  1202 History   First MD Initiated Contact with Patient 07/31/14 1332     Chief Complaint  Patient presents with  . Headache  . Irregular Heart Beat     (Consider location/radiation/quality/duration/timing/severity/associated sxs/prior Treatment) HPI Pt presents with c/o palpitations.  Pt states she felt her heart rate beating fast and her pulse was irregular.  She felt dizzy and lightheaded during the episode but no fainting.  No chest pain.  No leg swelling.  She had 2 glasses of wine last night, but this is her usual routine.  No fever/chills.  No increased caffeine intake.  She felt the palpitations subside prior to arriving in the ED.  No chest pain, no nausea.  Her husband spoke to cardiology and came to ED for evaluation.  Denies hx of thyroid problems, no recent weight loss.  No new supplements or energy drinks.  There are no other associated systemic symptoms, there are no other alleviating or modifying factors.   Past Medical History  Diagnosis Date  . Hypertension   . Premature ventricular contractions (PVCs) (VPCs)    Past Surgical History  Procedure Laterality Date  . Knee arthroscopy    . Hernia repair      left  . Breast biopsy      x2  . Skin cancer excision    . Colonoscopy w/ biopsies and polypectomy  2004    Dr. Cristal [place], [place]    Family History  Problem Relation Age of Onset  . Atrial fibrillation Mother   . Cancer Father     lymphoma  . Hypertension Father   . Colon cancer Neg Hx   . Stomach cancer Neg Hx    History  Substance Use Topics  . Smoking status: Never Smoker   . Smokeless tobacco: Never Used  . Alcohol Use: 0.5 oz/week    1 drink(s) per week   OB History   Grav Para Term Preterm Abortions TAB SAB Ect Mult Living                 Review of Systems ROS reviewed and all otherwise negative except for mentioned in HPI    Allergies  Review of patient's allergies indicates no known  allergies.  Home Medications   Prior to Admission medications   Medication Sig Start Date End Date Taking? Authorizing Provider  diclofenac (VOLTAREN) 75 MG EC tablet Take 75 mg by mouth 2 (two) times daily as needed (headaches).    Yes Historical Provider, MD  estradiol (VIVELLE-DOT) 0.0375 MG/24HR Place 1 patch onto the skin 2 (two) times a week.   Yes Historical Provider, MD  progesterone (PROMETRIUM) 100 MG capsule Take 100 mg by mouth daily.   Yes Historical Provider, MD  valsartan-hydrochlorothiazide (DIOVAN-HCT) 80-12.5 MG per tablet Take 1 tablet by mouth daily.   Yes Historical Provider, MD  Vitamin D, Ergocalciferol, (DRISDOL) 50000 UNITS CAPS capsule Take 50,000 Units by mouth every 7 (seven) days.   Yes Historical Provider, MD   BP 126/67  Pulse 79  Temp(Src) 97.8 F (36.6 C) (Oral)  Resp 16  SpO2 100% Vitals reviewed Physical Exam Physical Examination: General appearance - alert, well appearing, and in no distress Mental status - alert, oriented to person, place, and time Eyes - no conjunctival injection, no scleral icterus Mouth - mucous membranes moist, pharynx normal without lesions Chest - clear to auscultation, no wheezes, rales or rhonchi, symmetric air entry Heart - normal  rate, regular rhythm, normal S1, S2, no murmurs, rubs, clicks or gallops Abdomen - soft, nontender, nondistended, no masses or organomegaly Extremities - peripheral pulses normal, no pedal edema, no clubbing or cyanosis Skin - normal coloration and turgor, no rashes  ED Course  Procedures (including critical care time)  3:09 PM I have talked with Dr. Burt Knack, cardiology and Dr. Frances Nickels has seen patient.  They are in agreement that she is clear for discharged and can continue with workup as an outpatient.    Labs Review Labs Reviewed  BASIC METABOLIC PANEL - Abnormal; Notable for the following:    Glucose, Bld 101 (*)    All other components within normal limits  CBC  I-STAT TROPOININ, ED     Imaging Review Dg Chest 2 View  07/31/2014   CLINICAL DATA:  woke this morning with nausea vomiting and headache.  EXAM: CHEST  2 VIEW  COMPARISON:  None  FINDINGS: Normal heart size. No pleural effusion or edema. The lungs appear hyperinflated and there are coarsened interstitial markings identified bilaterally. No airspace consolidation.  IMPRESSION: 1. No acute cardiopulmonary abnormalities.   Electronically Signed   By: Kerby Moors M.D.   On: 07/31/2014 14:11     EKG Interpretation   Date/Time:  Saturday July 31 2014 12:06:50 EDT Ventricular Rate:  82 PR Interval:  156 QRS Duration: 76 QT Interval:  390 QTC Calculation: 455 R Axis:   -7 Text Interpretation:  Normal sinus rhythm Possible Left atrial enlargement  Low voltage QRS Abnormal ECG No old tracing to compare Confirmed by [facility]   MD, MARTHA [phone]) on 07/31/2014 4:20:33 PM      MDM   Final diagnoses:  Palpitations    Pt presenting with c/o palpitations which have now resolved. Workup in the ED is negative.  Pt has been seen by cardiology who agrees with plan for discharge and they will see patient for echo/holter monitor on an outpatient basis.  Discharged with strict return precautions.  Pt agreeable with plan.    Threasa Beards, MD 08/01/14 1400

## 2014-07-31 NOTE — ED Notes (Addendum)
She states she woke this am with a headache and nausea, vomited once and went back to bed. When she woke back up she states "i could feel my heart beat was irregular, it was all over the place, i finally felt it convert to normal but I still want to be checked out." Denies pain or SOB. A&Ox4.

## 2014-07-31 NOTE — Consult Note (Signed)
CARDIOLOGY CONSULT NOTE       Patient ID: Ruth Shields MRN: [id number] DOB/AGE: [birth year] 68 y.o.  Admit date: 07/31/2014 Referring Physician:  Linker/Cooper Primary Physician: Chancy Hurter, MD Primary Cardiologist:  Burt Knack Reason for Consultation:  Palpitations/dizzyness  Active Problems:   * No active hospital problems. *   HPI:  68 yo previously seen by Dr Verl Blalock many years ago  Palpitations with normal echo and PVC;s noted on holter.  Was on Toprol for a while but stopped on her own.  Under a lot of stress lately.  Was watching her 66 and 53 yo grandkids all weekend and driving from [place] to [place].  Her son TJ Senseney just got a staff position as a CHF attending at [employer].  There seems to be some stress/frictioni between her and her husband who is a pulmonologist/intensivist in [place].  She usually has 2 glasses of wine at night.  Did so last night Awoke this am with headache, nausea and vomiting   Felt weak and dizzy Pulse normally 60-70 and felt it skip and got as high as 100.  No chest pain or dyspnea No history of anemia or thyroid disease.  By the time she was seen at [facility] she felt better.  On telemetry in ER no arrhythmia seen and Hct 39.4 K 3.9  She has taken PO in ER and has no headache, or abdominal pain   ROS All other systems reviewed and negative except as noted above  Past Medical History  Diagnosis Date  . Hypertension   . Premature ventricular contractions (PVCs) (VPCs)     Family History  Problem Relation Age of Onset  . Atrial fibrillation Mother   . Cancer Father     lymphoma  . Hypertension Father   . Colon cancer Neg Hx   . Stomach cancer Neg Hx     History   Social History  . Marital Status: Married    Spouse Name: N/A    Number of Children: N/A  . Years of Education: N/A   Occupational History  . Not on file.   Social History Main Topics  . Smoking status: Never Smoker   . Smokeless tobacco: Never Used  . Alcohol Use: 0.5 oz/week    1  drink(s) per week  . Drug Use: No  . Sexual Activity: Not on file   Other Topics Concern  . Not on file   Social History Narrative  . No narrative on file    Past Surgical History  Procedure Laterality Date  . Knee arthroscopy    . Hernia repair      left  . Breast biopsy      x2  . Skin cancer excision    . Colonoscopy w/ biopsies and polypectomy  2004    Dr. Cristal Ford, [place]         Physical Exam: Blood pressure 115/74, pulse 77, temperature 97.8 F (36.6 C), temperature source Oral, resp. rate 14, SpO2 100.00%.   Affect appropriate Healthy:  appears stated age 68: normal Neck supple with no adenopathy JVP normal no bruits no thyromegaly Lungs clear with no wheezing and good diaphragmatic motion Heart:  S1/S2 no murmur, no rub, gallop or click PMI normal Abdomen: benighn, BS positve, no tenderness, no AAA no bruit.  No HSM or HJR Distal pulses intact with no bruits No edema Neuro non-focal Skin warm and dry No muscular weakness   Labs:   Lab Results  Component Value Date   WBC 5.9  07/31/2014   HGB 13.6 07/31/2014   HCT 39.4 07/31/2014   MCV 91.4 07/31/2014   PLT 230 07/31/2014    Recent Labs Lab 07/31/14 1226  NA 142  K 3.9  CL 101  CO2 27  BUN 12  CREATININE 0.65  CALCIUM 9.4  GLUCOSE 101*   No results found for this basename: CKTOTAL, CKMB, CKMBINDEX, TROPONINI    Lab Results  Component Value Date   CHOL 225* 04/29/2014   Lab Results  Component Value Date   HDL 101* 04/29/2014   Lab Results  Component Value Date   LDLCALC 116 04/29/2014   Lab Results  Component Value Date   TRIG 42 04/29/2014   No results found for this basename: CHOLHDL   No results found for this basename: LDLDIRECT      Radiology: Dg Chest 2 View  07/31/2014   CLINICAL DATA:  woke this morning with nausea vomiting and headache.  EXAM: CHEST  2 VIEW  COMPARISON:  None  FINDINGS: Normal heart size. No pleural effusion or edema. The lungs appear  hyperinflated and there are coarsened interstitial markings identified bilaterally. No airspace consolidation.  IMPRESSION: 1. No acute cardiopulmonary abnormalities.   Electronically Signed   By: Kerby Moors M.D.   On: 07/31/2014 14:11    EKG:  SR rate 82 LAE no pre excitation or arrhythmia low voltage    ASSESSMENT AND PLAN:  Palpitations:  Benign seems related to headache nausea and vomiting  History of PVC;s with previous beta blocker use.  Nothing on telemetry  May be prone to artrial arrhythmias with LAE on ECG, wine intake and what appears to be some marital stress  Will arrange outpatient f/u with Dr Burt Knack  No need for medication at this point or monitor    Signed: Jenkins Rouge 07/31/2014, 2:53 PM

## 2014-08-13 ENCOUNTER — Other Ambulatory Visit (HOSPITAL_COMMUNITY): Payer: Medicare Other

## 2014-08-17 ENCOUNTER — Other Ambulatory Visit (HOSPITAL_COMMUNITY): Payer: Self-pay | Admitting: Cardiovascular Disease

## 2014-08-17 ENCOUNTER — Ambulatory Visit (HOSPITAL_COMMUNITY): Payer: Medicare Other | Attending: Cardiology | Admitting: Radiology

## 2014-08-17 ENCOUNTER — Encounter: Payer: Medicare Other | Admitting: Cardiovascular Disease

## 2014-08-17 ENCOUNTER — Encounter: Payer: Self-pay | Admitting: Cardiovascular Disease

## 2014-08-17 DIAGNOSIS — R002 Palpitations: Secondary | ICD-10-CM

## 2014-08-17 DIAGNOSIS — I059 Rheumatic mitral valve disease, unspecified: Secondary | ICD-10-CM | POA: Insufficient documentation

## 2014-08-17 DIAGNOSIS — I359 Nonrheumatic aortic valve disorder, unspecified: Secondary | ICD-10-CM | POA: Diagnosis not present

## 2014-08-17 DIAGNOSIS — I079 Rheumatic tricuspid valve disease, unspecified: Secondary | ICD-10-CM | POA: Diagnosis not present

## 2014-08-17 DIAGNOSIS — R011 Cardiac murmur, unspecified: Secondary | ICD-10-CM | POA: Diagnosis not present

## 2014-08-17 NOTE — Progress Notes (Signed)
Echocardiogram performed.  

## 2014-08-18 ENCOUNTER — Encounter: Payer: Medicare Other | Admitting: Physician Assistant

## 2014-08-18 ENCOUNTER — Other Ambulatory Visit (HOSPITAL_COMMUNITY): Payer: Medicare Other

## 2014-08-20 ENCOUNTER — Encounter: Payer: Self-pay | Admitting: Cardiovascular Disease

## 2014-08-20 ENCOUNTER — Ambulatory Visit (INDEPENDENT_AMBULATORY_CARE_PROVIDER_SITE_OTHER): Payer: Medicare Other | Admitting: Cardiovascular Disease

## 2014-08-20 VITALS — BP 106/82 | HR 82 | Ht 65.0 in | Wt 136.4 lb

## 2014-08-20 DIAGNOSIS — R002 Palpitations: Secondary | ICD-10-CM | POA: Diagnosis not present

## 2014-08-20 NOTE — Patient Instructions (Signed)
Your physician recommends that you continue on your current medications as directed. Please refer to the Current Medication list given to you today.  Your physician wants you to follow-up in: 1 Year with Dr Turner You will receive a reminder letter in the mail two months in advance. If you don't receive a letter, please call our office to schedule the follow-up appointment.  

## 2014-08-20 NOTE — Progress Notes (Signed)
HPI:  68 year-old woman presenting for cardiology followup after recent emergency room evaluation for heart palpitations. She has a family history of atrial fibrillation in her mother who was diagnosed with this condition around age 40. The patient awoke with heart palpitations, headache, nausea, and vomiting, prompting her to go for ER evaluation. Her heart palpitations continued for a few hours. At the time of presentation in the emergency room, no arrhythmia was seen. She has had a followup echocardiogram.  She's had no recurrence of the symptoms she experienced last month. However, she has noted that her heart rate is a little bit elevated. She's had a feeling of anxiety. She has a history of PVCs and has had no symptoms reminiscent of her PVCs. She's had no recurrence of tachypalpitations. She is physically active with regular exercise. She denies exertional chest pain, chest pressure, or shortness of breath. She's had no lightheadedness or syncope. No other complaints.  Outpatient Encounter Prescriptions as of 08/20/2014  Medication Sig  . diclofenac (VOLTAREN) 75 MG EC tablet Take 75 mg by mouth 2 (two) times daily as needed (headaches).   Marland Kitchen estradiol (VIVELLE-DOT) 0.0375 MG/24HR Place 1 patch onto the skin 2 (two) times a week.  . progesterone (PROMETRIUM) 100 MG capsule Take 100 mg by mouth daily.  . valsartan-hydrochlorothiazide (DIOVAN-HCT) 80-12.5 MG per tablet Take 1 tablet by mouth daily.  . Vitamin D, Ergocalciferol, (DRISDOL) 50000 UNITS CAPS capsule Take 50,000 Units by mouth every 7 (seven) days.    No Known Allergies  Past Medical History  Diagnosis Date  . Hypertension   . Premature ventricular contractions (PVCs) (VPCs)     ROS: Negative except as per HPI  BP 106/82  Pulse 82  Ht 5\' 5"  (1.651 m)  Wt 136 lb 6.4 oz (61.871 kg)  BMI 22.70 kg/m2  PHYSICAL EXAM: Pt is alert and oriented, physically fit woman in NAD HEENT: normal Neck: JVP - normal, carotids 2+=  without bruits Lungs: CTA bilaterally CV: RRR without murmur or gallop Abd: soft, NT, Positive BS, no hepatomegaly Ext: no C/C/E, distal pulses intact and equal Skin: warm/dry no rash  2-D echocardiogram: Study Conclusions  - Left ventricle: The cavity size was normal. Wall thickness was normal. Systolic function was normal. The estimated ejection fraction was in the range of 55% to 60%. Wall motion was normal; there were no regional wall motion abnormalities. Doppler parameters are consistent with abnormal left ventricular relaxation (grade 1 diastolic dysfunction). - Aortic valve: There was mild regurgitation. - Mitral valve: There was mild regurgitation.  Impressions:  - Normal LV function; grade 1 diastolic dysfunction; mild AI, MR and TR.  ASSESSMENT AND PLAN: This is a 68 year old woman with palpitations. There has been no documentation of atrial fibrillation. Her echocardiogram was reviewed and she has mild valvular insufficiency, normal LV function, and normal atrial size. We discussed considerations for cardiac testing as well as medical therapy. She does not want to have a Holter or event monitor placed. She will go to the nearest emergency department if palpitations recur. She has been intolerant to beta blockers in the past, and is not inclined to take any further medication for palpitations.  I think a period of observation is entirely appropriate. I will see her back in one year unless problems arise in the interim. We discussed the link between alcohol consumption in atrial fibrillation. She drinks wine regularly but not in excess.  Sherren Mocha MD 08/20/2014 12:54 PM

## 2014-08-23 ENCOUNTER — Telehealth: Payer: Self-pay | Admitting: Cardiovascular Disease

## 2014-08-23 NOTE — Telephone Encounter (Signed)
I spoke with the pt and her son is a Film/video editor in [place].  He told her about an App on her phone that will record her heart rhythm.  The pt has experienced palpitations at rest and after exercise over the weekend.  She showed the rhythm to her son and he felt it was atrial tachycardia.  The pt is asymptomatic with her palpitations. She will send me her rhythm strips through [facility] health email. She said the DOB and name are incorrect on the strips because she does not put her correct information in any public website. I will await strips for Dr Burt Knack to review.

## 2014-08-23 NOTE — Telephone Encounter (Signed)
New message     Pt is having extra beats and wanted to talk to a nurse about it

## 2014-08-24 ENCOUNTER — Ambulatory Visit
Admission: RE | Admit: 2014-08-24 | Discharge: 2014-08-24 | Disposition: A | Discharge status: Home / Self Care | Payer: Medicare Other | Source: Ambulatory Visit

## 2014-08-24 DIAGNOSIS — Z1231 Encounter for screening mammogram for malignant neoplasm of breast: Secondary | ICD-10-CM | POA: Diagnosis not present

## 2014-08-24 NOTE — Progress Notes (Signed)
This encounter was created in error - please disregard.

## 2014-08-24 NOTE — Telephone Encounter (Signed)
Monitor strips received and placed in Dr [employer] folder for review.

## 2014-08-24 NOTE — Telephone Encounter (Signed)
Rhythm strips reviewed and demonstrates sinus rhythm with artifact. I do not see any arrhythmia in any of the strips available for review. Discussed findings with the patient.  Sherren Mocha 08/24/2014 10:49 AM

## 2014-08-25 ENCOUNTER — Other Ambulatory Visit: Payer: Self-pay | Admitting: Obstetrics and Gynecology

## 2014-08-25 DIAGNOSIS — R928 Other abnormal and inconclusive findings on diagnostic imaging of breast: Secondary | ICD-10-CM

## 2014-09-03 ENCOUNTER — Ambulatory Visit
Admission: RE | Admit: 2014-09-03 | Discharge: 2014-09-03 | Disposition: A | Discharge status: Home / Self Care | Payer: Medicare Other | Source: Ambulatory Visit | Attending: Obstetrics and Gynecology | Admitting: Obstetrics and Gynecology

## 2014-09-03 ENCOUNTER — Ambulatory Visit: Payer: Medicare Other | Admitting: Cardiovascular Disease

## 2014-09-03 DIAGNOSIS — R928 Other abnormal and inconclusive findings on diagnostic imaging of breast: Secondary | ICD-10-CM | POA: Diagnosis not present

## 2014-09-03 DIAGNOSIS — N6489 Other specified disorders of breast: Secondary | ICD-10-CM | POA: Diagnosis not present

## 2014-09-07 ENCOUNTER — Other Ambulatory Visit: Payer: Self-pay | Admitting: Obstetrics and Gynecology

## 2014-09-07 DIAGNOSIS — N841 Polyp of cervix uteri: Secondary | ICD-10-CM | POA: Diagnosis not present

## 2014-09-07 DIAGNOSIS — Z124 Encounter for screening for malignant neoplasm of cervix: Secondary | ICD-10-CM | POA: Diagnosis not present

## 2014-09-07 DIAGNOSIS — Z130 Encounter for screening for diseases of the blood and blood-forming organs and certain disorders involving the immune mechanism: Secondary | ICD-10-CM | POA: Diagnosis not present

## 2014-09-08 DIAGNOSIS — Z23 Encounter for immunization: Secondary | ICD-10-CM | POA: Diagnosis not present

## 2014-09-08 LAB — CYTOLOGY - PAP

## 2014-11-25 DIAGNOSIS — Z85828 Personal history of other malignant neoplasm of skin: Secondary | ICD-10-CM | POA: Diagnosis not present

## 2014-11-25 DIAGNOSIS — D229 Melanocytic nevi, unspecified: Secondary | ICD-10-CM | POA: Diagnosis not present

## 2014-11-25 DIAGNOSIS — Z85820 Personal history of malignant melanoma of skin: Secondary | ICD-10-CM | POA: Diagnosis not present

## 2014-11-25 DIAGNOSIS — L821 Other seborrheic keratosis: Secondary | ICD-10-CM | POA: Diagnosis not present

## 2014-11-25 DIAGNOSIS — L570 Actinic keratosis: Secondary | ICD-10-CM | POA: Diagnosis not present

## 2014-11-25 DIAGNOSIS — Z86018 Personal history of other benign neoplasm: Secondary | ICD-10-CM | POA: Diagnosis not present

## 2015-03-22 ENCOUNTER — Encounter: Payer: Self-pay | Admitting: *Deleted

## 2015-03-23 ENCOUNTER — Ambulatory Visit (INDEPENDENT_AMBULATORY_CARE_PROVIDER_SITE_OTHER): Payer: Self-pay | Admitting: *Deleted

## 2015-03-23 DIAGNOSIS — I83893 Varicose veins of bilateral lower extremities with other complications: Secondary | ICD-10-CM

## 2015-03-23 DIAGNOSIS — I8393 Asymptomatic varicose veins of bilateral lower extremities: Secondary | ICD-10-CM

## 2015-03-23 NOTE — Progress Notes (Signed)
X=.3% Sotradecol administered with a 27g butterfly.  Patient received a total of 6cc.  Her legs have come a long way but needed a little clean up. Treated larger vessels with sclero and smaller with CL. Tol well. Will follow prn.  Cutaneous Laser:pulsed mode  810j/cm2 400 ms delay  13 ms Duration 0.5 spot  Total pulses: 920 Total energy 1.460  Total time::11  Photos: No.  Compression stockings applied: Yes.

## 2015-06-02 ENCOUNTER — Other Ambulatory Visit: Payer: Self-pay | Admitting: Family

## 2015-07-21 ENCOUNTER — Other Ambulatory Visit: Payer: Self-pay | Admitting: Internal Medicine

## 2015-08-03 ENCOUNTER — Other Ambulatory Visit: Payer: Self-pay

## 2015-08-03 DIAGNOSIS — Z1231 Encounter for screening mammogram for malignant neoplasm of breast: Secondary | ICD-10-CM

## 2015-09-14 ENCOUNTER — Ambulatory Visit
Admission: RE | Admit: 2015-09-14 | Discharge: 2015-09-14 | Disposition: A | Discharge status: Home / Self Care | Payer: Medicare Other | Source: Ambulatory Visit

## 2015-09-14 DIAGNOSIS — Z1231 Encounter for screening mammogram for malignant neoplasm of breast: Secondary | ICD-10-CM

## 2015-09-20 DIAGNOSIS — Z01419 Encounter for gynecological examination (general) (routine) without abnormal findings: Secondary | ICD-10-CM | POA: Diagnosis not present

## 2015-09-20 DIAGNOSIS — Z6824 Body mass index (BMI) 24.0-24.9, adult: Secondary | ICD-10-CM | POA: Diagnosis not present

## 2015-09-29 DIAGNOSIS — R5383 Other fatigue: Secondary | ICD-10-CM | POA: Diagnosis not present

## 2015-09-29 DIAGNOSIS — I10 Essential (primary) hypertension: Secondary | ICD-10-CM | POA: Diagnosis not present

## 2015-09-29 DIAGNOSIS — E559 Vitamin D deficiency, unspecified: Secondary | ICD-10-CM | POA: Diagnosis not present

## 2015-10-04 DIAGNOSIS — Z23 Encounter for immunization: Secondary | ICD-10-CM | POA: Diagnosis not present

## 2015-10-05 ENCOUNTER — Encounter: Payer: Self-pay | Admitting: Cardiovascular Disease

## 2015-10-14 ENCOUNTER — Encounter: Payer: Self-pay | Admitting: Cardiovascular Disease

## 2015-10-19 DIAGNOSIS — H04123 Dry eye syndrome of bilateral lacrimal glands: Secondary | ICD-10-CM | POA: Diagnosis not present

## 2015-10-19 DIAGNOSIS — H35361 Drusen (degenerative) of macula, right eye: Secondary | ICD-10-CM | POA: Diagnosis not present

## 2015-10-19 DIAGNOSIS — H5315 Visual distortions of shape and size: Secondary | ICD-10-CM | POA: Diagnosis not present

## 2015-10-19 DIAGNOSIS — H35362 Drusen (degenerative) of macula, left eye: Secondary | ICD-10-CM | POA: Diagnosis not present

## 2015-10-25 ENCOUNTER — Ambulatory Visit (INDEPENDENT_AMBULATORY_CARE_PROVIDER_SITE_OTHER): Payer: Medicare Other | Admitting: Cardiovascular Disease

## 2015-10-25 ENCOUNTER — Encounter: Payer: Self-pay | Admitting: Cardiovascular Disease

## 2015-10-25 VITALS — BP 124/80 | HR 60 | Ht 65.0 in | Wt 143.8 lb

## 2015-10-25 DIAGNOSIS — I10 Essential (primary) hypertension: Secondary | ICD-10-CM | POA: Diagnosis not present

## 2015-10-25 DIAGNOSIS — R002 Palpitations: Secondary | ICD-10-CM

## 2015-10-25 MED ORDER — VALSARTAN-HYDROCHLOROTHIAZIDE 80-12.5 MG PO TABS: 1.0000 | ORAL_TABLET | Freq: Every day | ORAL | Status: DC

## 2015-10-25 NOTE — Patient Instructions (Signed)
Medication Instructions:  Your physician recommends that you continue on your current medications as directed. Please refer to the Current Medication list given to you today.  Dr Burt Knack will continue to refill cardiac medications.   Labwork: No new orders.   Testing/Procedures: No new orders.   Follow-Up: Your physician recommends that you schedule a follow-up appointment as needed with Dr Burt Knack.  Possible Primary Care: Dr Colin Benton and Dr Garret Reddish  Any Other Special Instructions Will Be Listed Below (If Applicable).     If you need a refill on your cardiac medications before your next appointment, please call your pharmacy.

## 2015-10-25 NOTE — Progress Notes (Signed)
Cardiology Office Note Date:  10/25/2015   ID:  Ruth Shields, DOB 01/13/1946, MRN [id number]  PCP:  Chancy Hurter, MD  Cardiologist:  Sherren Mocha, MD    Chief Complaint  Patient presents with  . Follow-up    refilled valsartan-HCTZ    History of Present Illness: Ruth Shields is a 69 y.o. female who presents for  Follow-up heart palpitations. She was seen 1 year ago after experiencing tachypalpitations, nausea, and vomiting. No arrhythmia was seen at the time of her symptoms (underwent ER evaluation).  An echocardiogram at that time demonstrated normal LV size and systolic function with mild valvular regurgitation involving the aortic valve, mitral valve, and tricuspid valve.  The patient is doing very well. She's had no recurrence of heart palpitations. She exercises vigorously and has no exertional symptoms.Today, she denies symptoms of palpitations, chest pain, shortness of breath, orthopnea, PND, lower extremity edema, dizziness, or syncope.  Past Medical History  Diagnosis Date  . Hypertension   . Premature ventricular contractions (PVCs) (VPCs)     Past Surgical History  Procedure Laterality Date  . Knee arthroscopy    . Hernia repair      left  . Breast biopsy      x2  . Skin cancer excision    . Colonoscopy w/ biopsies and polypectomy  2004    [place]. Cristal [place], [place]     Current Outpatient Prescriptions  Medication Sig Dispense Refill  . diclofenac (VOLTAREN) 75 MG EC tablet Take 75 mg by mouth 2 (two) times daily as needed (headaches).     Marland Kitchen estradiol (VIVELLE-DOT) 0.0375 MG/24HR Place 1 patch onto the skin 2 (two) times a week.    Marland Kitchen NEXIUM 40 MG capsule Take 40 mg by mouth daily as needed. For heartburn    . progesterone (PROMETRIUM) 100 MG capsule Take 100 mg by mouth daily.    . Vitamin D, Ergocalciferol, (DRISDOL) 50000 UNITS CAPS capsule Take 50,000 Units by mouth every 7 (seven) days.    . valsartan-hydrochlorothiazide (DIOVAN-HCT) 80-12.5 MG  tablet Take 1 tablet by mouth daily. 90 tablet 3   No current facility-administered medications for this visit.   Allergies:   Review of patient's allergies indicates no known allergies.   Social History:  The patient  reports that she has never smoked. She has never used smokeless tobacco. She reports that she drinks about 0.5 oz of alcohol per week. She reports that she does not use illicit drugs.   Family History:  The patient's  family history includes Atrial fibrillation in her mother; Cancer in her father; Hypertension in her father. There is no history of Colon cancer or Stomach cancer.   ROS:  Please see the history of present illness.  All other systems are reviewed and negative.   PHYSICAL EXAM: VS:  BP 124/80 mmHg  Pulse 60  Ht 5\' 5"  (1.651 m)  Wt 143 lb 12.8 oz (65.227 kg)  BMI 23.93 kg/m2 , BMI Body mass index is 23.93 kg/(m^2). GEN: Well nourished, well developed, in no acute distress HEENT: normal Neck: no JVD, no masses. No carotid bruits Cardiac: RRR without murmur or gallop                Respiratory:  clear to auscultation bilaterally, normal work of breathing GI: soft, nontender, nondistended, + BS MS: no deformity or atrophy Ext: no pretibial edema, pedal pulses 2+= bilaterally Skin: warm and dry, no rash Neuro:  Strength and sensation are intact Psych: euthymic mood, full  affect  EKG:  EKG is ordered today. The ekg ordered today shows  Normal sinus rhythm 60 bpm , low-voltage QRS.  Recent Labs: No results found for requested labs within last 365 days.   Lipid Panel     Component Value Date/Time   CHOL 225* 04/29/2014   TRIG 42 04/29/2014   HDL 101* 04/29/2014   LDLCALC 116 04/29/2014      Wt Readings from Last 3 Encounters:  10/25/15 143 lb 12.8 oz (65.227 kg)  08/20/14 136 lb 6.4 oz (61.871 kg)  08/17/14 135 lb 12.8 oz (61.598 kg)    Cardiac Studies Reviewed: 2D Echo 2015: 2-D echocardiogram: Study Conclusions  - Left ventricle: The  cavity size was normal. Wall thickness was normal. Systolic function was normal. The estimated ejection fraction was in the range of 55% to 60%. Wall motion was normal; there were no regional wall motion abnormalities. Doppler parameters are consistent with abnormal left ventricular relaxation (grade 1 diastolic dysfunction). - Aortic valve: There was mild regurgitation. - Mitral valve: There was mild regurgitation.  Impressions:  - Normal LV function; grade 1 diastolic dysfunction; mild AI, MR and TR.  ASSESSMENT AND PLAN: 1. Heart palpitations: No recurrence of symptoms over the past year. The patient's 12-lead EKG is normal. She exercises at a high workload without any symptoms. I will see her back as needed.   2. Essential hypertension: Blood pressure is well controlled on valsartan/hydrochlorothiazide. Recent labs reviewed with normal creatinine and potassium. Medication is refilled.  Current medicines are reviewed with the patient today.  The patient does not have concerns regarding medicines.  Labs/ tests ordered today include:   Orders Placed This Encounter  Procedures  . EKG 12-Lead   Disposition:   FU one year  Signed, Sherren Mocha, MD  10/25/2015 1:33 PM    [facility] [place], [place], [place]  [address] Phone: [phone]; Fax: [phone]

## 2015-11-07 ENCOUNTER — Ambulatory Visit: Payer: Medicare Other | Admitting: Cardiovascular Disease

## 2015-12-30 DIAGNOSIS — G8929 Other chronic pain: Secondary | ICD-10-CM | POA: Diagnosis not present

## 2015-12-30 DIAGNOSIS — M25562 Pain in left knee: Secondary | ICD-10-CM | POA: Diagnosis not present

## 2015-12-30 DIAGNOSIS — M25561 Pain in right knee: Secondary | ICD-10-CM | POA: Diagnosis not present

## 2016-01-16 DIAGNOSIS — L570 Actinic keratosis: Secondary | ICD-10-CM | POA: Diagnosis not present

## 2016-01-16 DIAGNOSIS — Z23 Encounter for immunization: Secondary | ICD-10-CM | POA: Diagnosis not present

## 2016-01-16 DIAGNOSIS — Z85828 Personal history of other malignant neoplasm of skin: Secondary | ICD-10-CM | POA: Diagnosis not present

## 2016-01-16 DIAGNOSIS — L821 Other seborrheic keratosis: Secondary | ICD-10-CM | POA: Diagnosis not present

## 2016-01-16 DIAGNOSIS — Z86018 Personal history of other benign neoplasm: Secondary | ICD-10-CM | POA: Diagnosis not present

## 2016-01-16 DIAGNOSIS — Z85820 Personal history of malignant melanoma of skin: Secondary | ICD-10-CM | POA: Diagnosis not present

## 2016-01-16 DIAGNOSIS — D225 Melanocytic nevi of trunk: Secondary | ICD-10-CM | POA: Diagnosis not present

## 2016-01-19 DIAGNOSIS — S83232A Complex tear of medial meniscus, current injury, left knee, initial encounter: Secondary | ICD-10-CM | POA: Diagnosis not present

## 2016-01-26 ENCOUNTER — Ambulatory Visit: Payer: Medicare Other | Admitting: Cardiovascular Disease

## 2016-02-02 DIAGNOSIS — S83282A Other tear of lateral meniscus, current injury, left knee, initial encounter: Secondary | ICD-10-CM | POA: Diagnosis not present

## 2016-02-02 DIAGNOSIS — S83412A Sprain of medial collateral ligament of left knee, initial encounter: Secondary | ICD-10-CM | POA: Diagnosis not present

## 2016-02-02 DIAGNOSIS — M25562 Pain in left knee: Secondary | ICD-10-CM | POA: Diagnosis not present

## 2016-02-02 DIAGNOSIS — S83232D Complex tear of medial meniscus, current injury, left knee, subsequent encounter: Secondary | ICD-10-CM | POA: Diagnosis not present

## 2016-02-02 DIAGNOSIS — S83232A Complex tear of medial meniscus, current injury, left knee, initial encounter: Secondary | ICD-10-CM | POA: Diagnosis not present

## 2016-02-23 DIAGNOSIS — M23304 Other meniscus derangements, unspecified medial meniscus, left knee: Secondary | ICD-10-CM | POA: Diagnosis not present

## 2016-03-02 DIAGNOSIS — S83232A Complex tear of medial meniscus, current injury, left knee, initial encounter: Secondary | ICD-10-CM | POA: Diagnosis not present

## 2016-03-02 DIAGNOSIS — M23304 Other meniscus derangements, unspecified medial meniscus, left knee: Secondary | ICD-10-CM | POA: Diagnosis not present

## 2016-03-02 DIAGNOSIS — Y929 Unspecified place or not applicable: Secondary | ICD-10-CM | POA: Diagnosis not present

## 2016-03-02 DIAGNOSIS — M23222 Derangement of posterior horn of medial meniscus due to old tear or injury, left knee: Secondary | ICD-10-CM | POA: Diagnosis not present

## 2016-03-02 DIAGNOSIS — Y9302 Activity, running: Secondary | ICD-10-CM | POA: Diagnosis not present

## 2016-03-02 DIAGNOSIS — M23262 Derangement of other lateral meniscus due to old tear or injury, left knee: Secondary | ICD-10-CM | POA: Diagnosis not present

## 2016-03-27 DIAGNOSIS — M7542 Impingement syndrome of left shoulder: Secondary | ICD-10-CM | POA: Diagnosis not present

## 2016-03-27 DIAGNOSIS — M25512 Pain in left shoulder: Secondary | ICD-10-CM | POA: Diagnosis not present

## 2016-04-25 DIAGNOSIS — M25511 Pain in right shoulder: Secondary | ICD-10-CM | POA: Diagnosis not present

## 2016-04-25 DIAGNOSIS — M7542 Impingement syndrome of left shoulder: Secondary | ICD-10-CM | POA: Diagnosis not present

## 2016-05-01 DIAGNOSIS — M25511 Pain in right shoulder: Secondary | ICD-10-CM | POA: Diagnosis not present

## 2016-05-01 DIAGNOSIS — M7542 Impingement syndrome of left shoulder: Secondary | ICD-10-CM | POA: Diagnosis not present

## 2016-05-08 DIAGNOSIS — M25511 Pain in right shoulder: Secondary | ICD-10-CM | POA: Diagnosis not present

## 2016-05-08 DIAGNOSIS — M7542 Impingement syndrome of left shoulder: Secondary | ICD-10-CM | POA: Diagnosis not present

## 2016-05-23 DIAGNOSIS — M7542 Impingement syndrome of left shoulder: Secondary | ICD-10-CM | POA: Diagnosis not present

## 2016-05-23 DIAGNOSIS — M25511 Pain in right shoulder: Secondary | ICD-10-CM | POA: Diagnosis not present

## 2016-06-14 DIAGNOSIS — M7582 Other shoulder lesions, left shoulder: Secondary | ICD-10-CM | POA: Diagnosis not present

## 2016-06-14 DIAGNOSIS — M75112 Incomplete rotator cuff tear or rupture of left shoulder, not specified as traumatic: Secondary | ICD-10-CM | POA: Diagnosis not present

## 2016-06-14 DIAGNOSIS — M19012 Primary osteoarthritis, left shoulder: Secondary | ICD-10-CM | POA: Diagnosis not present

## 2016-06-25 DIAGNOSIS — M25512 Pain in left shoulder: Secondary | ICD-10-CM | POA: Diagnosis not present

## 2016-06-25 DIAGNOSIS — M7502 Adhesive capsulitis of left shoulder: Secondary | ICD-10-CM | POA: Diagnosis not present

## 2016-07-24 DIAGNOSIS — M7542 Impingement syndrome of left shoulder: Secondary | ICD-10-CM | POA: Diagnosis not present

## 2016-08-09 ENCOUNTER — Other Ambulatory Visit: Payer: Self-pay | Admitting: Obstetrics and Gynecology

## 2016-08-09 DIAGNOSIS — Z1231 Encounter for screening mammogram for malignant neoplasm of breast: Secondary | ICD-10-CM

## 2016-09-04 DIAGNOSIS — D485 Neoplasm of uncertain behavior of skin: Secondary | ICD-10-CM | POA: Diagnosis not present

## 2016-09-04 DIAGNOSIS — L570 Actinic keratosis: Secondary | ICD-10-CM | POA: Diagnosis not present

## 2016-09-04 DIAGNOSIS — D0472 Carcinoma in situ of skin of left lower limb, including hip: Secondary | ICD-10-CM | POA: Diagnosis not present

## 2016-09-04 DIAGNOSIS — Z23 Encounter for immunization: Secondary | ICD-10-CM | POA: Diagnosis not present

## 2016-09-24 ENCOUNTER — Ambulatory Visit
Admission: RE | Admit: 2016-09-24 | Discharge: 2016-09-24 | Disposition: A | Discharge status: Home / Self Care | Payer: Medicare Other | Source: Ambulatory Visit | Attending: Obstetrics and Gynecology | Admitting: Obstetrics and Gynecology

## 2016-09-24 DIAGNOSIS — Z6824 Body mass index (BMI) 24.0-24.9, adult: Secondary | ICD-10-CM | POA: Diagnosis not present

## 2016-09-24 DIAGNOSIS — Z124 Encounter for screening for malignant neoplasm of cervix: Secondary | ICD-10-CM | POA: Diagnosis not present

## 2016-09-24 DIAGNOSIS — Z1231 Encounter for screening mammogram for malignant neoplasm of breast: Secondary | ICD-10-CM

## 2016-09-24 LAB — HM PAP SMEAR: HM Pap smear: NEGATIVE

## 2016-09-27 DIAGNOSIS — Z23 Encounter for immunization: Secondary | ICD-10-CM | POA: Diagnosis not present

## 2016-10-03 DIAGNOSIS — D0472 Carcinoma in situ of skin of left lower limb, including hip: Secondary | ICD-10-CM | POA: Diagnosis not present

## 2016-10-03 DIAGNOSIS — Z23 Encounter for immunization: Secondary | ICD-10-CM | POA: Diagnosis not present

## 2016-10-19 ENCOUNTER — Other Ambulatory Visit: Payer: Self-pay | Admitting: Cardiovascular Disease

## 2016-10-19 DIAGNOSIS — I10 Essential (primary) hypertension: Secondary | ICD-10-CM

## 2016-10-19 DIAGNOSIS — R002 Palpitations: Secondary | ICD-10-CM

## 2016-10-19 NOTE — Telephone Encounter (Signed)
Per last office note, Dr Burt Knack will continue to refill cardiac meds but patient is prn follow up. Ok to refill this for another year? Please advise. Thanks, MI

## 2016-10-22 NOTE — Telephone Encounter (Signed)
Okay to refill? 

## 2016-11-09 DIAGNOSIS — E559 Vitamin D deficiency, unspecified: Secondary | ICD-10-CM | POA: Diagnosis not present

## 2016-11-09 DIAGNOSIS — I10 Essential (primary) hypertension: Secondary | ICD-10-CM | POA: Diagnosis not present

## 2016-11-09 DIAGNOSIS — R300 Dysuria: Secondary | ICD-10-CM | POA: Diagnosis not present

## 2016-11-09 DIAGNOSIS — R5382 Chronic fatigue, unspecified: Secondary | ICD-10-CM | POA: Diagnosis not present

## 2016-11-09 LAB — CBC AND DIFFERENTIAL
HEMATOCRIT: 37 % (ref 36–46)
HEMOGLOBIN: 12.4 g/dL (ref 12.0–16.0)
Platelets: 228 10*3/uL (ref 150–399)
WBC: 4 10^3/mL

## 2016-11-09 LAB — BASIC METABOLIC PANEL
BUN: 18 mg/dL (ref 4–21)
CREATININE: 0.8 mg/dL (ref <=1.1)
Potassium: 3.9 mmol/L (ref 3.4–5.3)
Sodium: 137 mmol/L (ref 137–147)

## 2016-11-09 LAB — HEPATIC FUNCTION PANEL
ALT: 21 U/L (ref 7–35)
AST: 28 U/L (ref 13–35)
Alkaline Phosphatase: 39 U/L (ref 25–125)
BILIRUBIN, TOTAL: 0.4 mg/dL

## 2016-11-09 LAB — TSH: TSH: 1.14 u[IU]/mL (ref <=5.90)

## 2016-11-12 ENCOUNTER — Ambulatory Visit (INDEPENDENT_AMBULATORY_CARE_PROVIDER_SITE_OTHER): Payer: Medicare Other | Admitting: Family Medicine

## 2016-11-12 ENCOUNTER — Encounter: Payer: Self-pay | Admitting: Family Medicine

## 2016-11-12 DIAGNOSIS — Z79890 Hormone replacement therapy (postmenopausal): Secondary | ICD-10-CM | POA: Diagnosis not present

## 2016-11-12 DIAGNOSIS — M810 Age-related osteoporosis without current pathological fracture: Secondary | ICD-10-CM

## 2016-11-12 DIAGNOSIS — K219 Gastro-esophageal reflux disease without esophagitis: Secondary | ICD-10-CM

## 2016-11-12 DIAGNOSIS — I10 Essential (primary) hypertension: Secondary | ICD-10-CM | POA: Diagnosis not present

## 2016-11-12 MED ORDER — VALSARTAN-HYDROCHLOROTHIAZIDE 80-12.5 MG PO TABS: 1.0000 | ORAL_TABLET | Freq: Every day | ORAL | 3 refills | Status: DC

## 2016-11-12 MED ORDER — SPIRONOLACTONE 25 MG PO TABS: 25.0000 mg | ORAL_TABLET | Freq: Every day | ORAL | 3 refills | Status: DC

## 2016-11-12 NOTE — Assessment & Plan Note (Signed)
S: controlled with rare nexium A/P: discussed cardiac/kidney/cva possible associations- given rare use unlikely significant risk.

## 2016-11-12 NOTE — Patient Instructions (Addendum)
Sign release of information at the check out desk for Dr. Runell Gess- bone density, then also immunization records.   I will look out for your bloodwork

## 2016-11-12 NOTE — Assessment & Plan Note (Signed)
S:  Cared for by Dr. Runell Gess- estradiol and progesterone A/P: we discussed risks to her health. She has also discussed with GYN and believes benefits outweight risks at prsent and will continue

## 2016-11-12 NOTE — Assessment & Plan Note (Signed)
S: controlled on Valsartan-hctz, spironolactone 25mg . Apparently the spironolactone has helped with her baseline edema.  BP Readings from Last 3 Encounters:  11/12/16 122/82  10/25/15 124/80  08/20/14 106/82  A/P:Continue current meds:  She sent [place] a copy of her labs from Friday- will look out for these with interest in potassium and creatinine in particular.

## 2016-11-12 NOTE — Progress Notes (Signed)
Pre visit review using our clinic review tool, if applicable. No additional management support is needed unless otherwise documented below in the visit note. 

## 2016-11-12 NOTE — Progress Notes (Signed)
Phone: [phone]  Subjective:  Patient presents today to establish care with me as their new primary care provider. Patient was formerly a patient of Dr. Leanne Chang. Chief complaint-noted.   See problem oriented charting ROS- No chest pain or shortness of breath. No headache or blurry vision. No recent fractures  The following were reviewed and entered/updated in epic: Past Medical History:  Diagnosis Date  . Hypertension   . Premature ventricular contractions (PVCs) (VPCs)    Patient Active Problem List   Diagnosis Date Noted  . Osteoporosis 11/12/2016    Priority: Medium  . Hormone replacement therapy 11/12/2016    Priority: Medium  . Essential hypertension 03/21/2010    Priority: Medium  . GERD (gastroesophageal reflux disease) 11/12/2016    Priority: Low  . Palpitations 07/31/2014    Priority: Low  . MURMUR 03/21/2010    Priority: Low   Past Surgical History:  Procedure Laterality Date  . BREAST BIOPSY     x2- benign  . COLONOSCOPY W/ BIOPSIES AND POLYPECTOMY  2004   Dr. Cristal Ford, [place]   . HERNIA REPAIR     left  . KNEE ARTHROSCOPY     left and right- torn meniscus- [facility]  . SKIN CANCER EXCISION     Dr. Delman Cheadle    Family History  Problem Relation Age of Onset  . Atrial fibrillation Mother     30 and independent  . Cancer Father     lymphoma  . Hypertension Father   . Parkinson's disease Father     18  . Healthy Brother   . Other Brother     died in [place]  . Colon cancer Neg Hx   . Stomach cancer Neg Hx     Medications- reviewed and updated Current Outpatient Prescriptions  Medication Sig Dispense Refill  . diclofenac (VOLTAREN) 75 MG EC tablet Take 75 mg by mouth 2 (two) times daily as needed (headaches).     Marland Kitchen estradiol (VIVELLE-DOT) 0.0375 MG/24HR Place 1 patch onto the skin 2 (two) times a week.    Marland Kitchen NEXIUM 40 MG capsule Take 40 mg by mouth daily as needed. For heartburn    . progesterone (PROMETRIUM) 100 MG capsule Take 100  mg by mouth daily.    Marland Kitchen spironolactone (ALDACTONE) 25 MG tablet Take 1 tablet (25 mg total) by mouth daily. 91 tablet 3  . valsartan-hydrochlorothiazide (DIOVAN-HCT) 80-12.5 MG tablet Take 1 tablet by mouth daily. 91 tablet 3  . Vitamin D, Ergocalciferol, (DRISDOL) 50000 UNITS CAPS capsule Take 50,000 Units by mouth every 7 (seven) days.     No current facility-administered medications for this visit.     Allergies-reviewed and updated No Known Allergies  Social History   Social History  . Marital status: Married    Spouse name: N/A  . Number of children: N/A  . Years of education: N/A   Social History Main Topics  . Smoking status: Never Smoker  . Smokeless tobacco: Never Used  . Alcohol use 4.2 oz/week    7 Glasses of wine per week  . Drug use: No  . Sexual activity: Not Asked   Other Topics Concern  . None   Social History Narrative   Married. 2 children (11 son with 46 sons, 38 year old married- horse and dogs) in 2017.    Lives in [place].       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 Oropharynx normal CV: RRR no murmurs rubs or gallops (do not detect prior murmur noted) Lungs: CTAB no crackles, wheeze, rhonchi Abdomen: soft/nontender/nondistended/normal bowel sounds. Normal weight  Ext: no edema Skin: warm, dry Neuro: grossly normal, moves all extremities, PERRLA  Assessment/Plan:  Essential hypertension S: controlled on Valsartan-hctz,  spironolactone 25mg . Apparently the spironolactone has helped with her baseline edema.  BP Readings from Last 3 Encounters:  11/12/16 122/82  10/25/15 124/80  08/20/14 106/82  A/P:Continue current meds:  She sent [place] a copy of her labs from Friday- will look out for these with interest in potassium and creatinine in particular.    GERD (gastroesophageal reflux disease) S: controlled with rare nexium A/P: discussed cardiac/kidney/cva possible associations- given rare use unlikely significant risk.    Osteoporosis S:Fosamax 5-6 years ago. 5 years took at least she believes. Records with GYN A/P: we discussed we should get records from [place]. She is on vitamin D weekly 50k units- getting lab records soon.  May need to update bone density next visit   Hormone replacement therapy S:  Cared for by Dr. Runell Gess- estradiol and progesterone A/P: we discussed risks to her health. She has also discussed with GYN and believes benefits outweight risks at prsent and will continue  Return in about 1 year (around 11/12/2017) for follow up- or sooner if needed.  Meds ordered this encounter  Medications  . DISCONTD: spironolactone (ALDACTONE) 25 MG tablet    Sig: Take 25 mg by mouth daily.  . valsartan-hydrochlorothiazide (DIOVAN-HCT) 80-12.5 MG tablet    Sig: Take 1 tablet by mouth daily.    Dispense:  91 tablet    Refill:  3  . spironolactone (ALDACTONE) 25 MG tablet    Sig: Take 1 tablet (25 mg total) by mouth daily.    Dispense:  91 tablet    Refill:  3   Could see every 6 months for BMET but has been very stable and we did not opt for this  Return precautions advised.   Garret Reddish, MD

## 2016-11-12 NOTE — Assessment & Plan Note (Signed)
S:Fosamax 5-6 years ago. 5 years took at least she believes. Records with GYN A/P: we discussed we should get records from [place]. She is on vitamin D weekly 50k units- getting lab records soon.  May need to update bone density next visit

## 2016-11-15 ENCOUNTER — Encounter: Payer: Self-pay | Admitting: Family Medicine

## 2016-11-26 DIAGNOSIS — E782 Mixed hyperlipidemia: Secondary | ICD-10-CM | POA: Diagnosis not present

## 2016-11-26 DIAGNOSIS — H5203 Hypermetropia, bilateral: Secondary | ICD-10-CM | POA: Diagnosis not present

## 2016-11-26 DIAGNOSIS — R350 Frequency of micturition: Secondary | ICD-10-CM | POA: Diagnosis not present

## 2016-11-26 DIAGNOSIS — H5310 Unspecified subjective visual disturbances: Secondary | ICD-10-CM | POA: Diagnosis not present

## 2016-11-26 DIAGNOSIS — H04123 Dry eye syndrome of bilateral lacrimal glands: Secondary | ICD-10-CM | POA: Diagnosis not present

## 2016-11-26 DIAGNOSIS — G43909 Migraine, unspecified, not intractable, without status migrainosus: Secondary | ICD-10-CM | POA: Diagnosis not present

## 2016-11-29 ENCOUNTER — Encounter: Payer: Self-pay | Admitting: Family Medicine

## 2016-12-18 ENCOUNTER — Encounter: Payer: Self-pay | Admitting: Family Medicine

## 2017-01-31 DIAGNOSIS — Z23 Encounter for immunization: Secondary | ICD-10-CM | POA: Diagnosis not present

## 2017-01-31 DIAGNOSIS — L723 Sebaceous cyst: Secondary | ICD-10-CM | POA: Diagnosis not present

## 2017-02-14 DIAGNOSIS — D2272 Melanocytic nevi of left lower limb, including hip: Secondary | ICD-10-CM | POA: Diagnosis not present

## 2017-02-14 DIAGNOSIS — Z86018 Personal history of other benign neoplasm: Secondary | ICD-10-CM | POA: Diagnosis not present

## 2017-02-14 DIAGNOSIS — Z85820 Personal history of malignant melanoma of skin: Secondary | ICD-10-CM | POA: Diagnosis not present

## 2017-02-14 DIAGNOSIS — Z808 Family history of malignant neoplasm of other organs or systems: Secondary | ICD-10-CM | POA: Diagnosis not present

## 2017-02-14 DIAGNOSIS — Z23 Encounter for immunization: Secondary | ICD-10-CM | POA: Diagnosis not present

## 2017-02-14 DIAGNOSIS — Z85828 Personal history of other malignant neoplasm of skin: Secondary | ICD-10-CM | POA: Diagnosis not present

## 2017-02-14 DIAGNOSIS — D225 Melanocytic nevi of trunk: Secondary | ICD-10-CM | POA: Diagnosis not present

## 2017-03-06 ENCOUNTER — Encounter: Payer: Self-pay | Admitting: Gastroenterology

## 2017-03-12 ENCOUNTER — Encounter: Payer: Self-pay | Admitting: Gastroenterology

## 2017-04-24 ENCOUNTER — Ambulatory Visit (AMBULATORY_SURGERY_CENTER): Payer: Self-pay

## 2017-04-24 VITALS — Ht 65.0 in | Wt 144.8 lb

## 2017-04-24 DIAGNOSIS — Z86010 Personal history of colonic polyps: Secondary | ICD-10-CM

## 2017-04-24 MED ORDER — NA SULFATE-K SULFATE-MG SULF 17.5-3.13-1.6 GM/177ML PO SOLN: 1.0000 | Freq: Once | ORAL | 0 refills | Status: AC

## 2017-04-24 NOTE — Progress Notes (Signed)
Denies allergies to eggs or soy products. Denies complication of anesthesia or sedation. Denies use of weight loss medication. Denies use of O2.   Emmi instructions declined, patient has seen videos.

## 2017-05-10 ENCOUNTER — Ambulatory Visit (AMBULATORY_SURGERY_CENTER): Payer: Medicare Other | Admitting: Gastroenterology

## 2017-05-10 ENCOUNTER — Encounter: Payer: Self-pay | Admitting: Gastroenterology

## 2017-05-10 VITALS — BP 109/80 | HR 66 | Temp 97.7°F | Resp 14 | Ht 65.0 in | Wt 144.0 lb

## 2017-05-10 DIAGNOSIS — D123 Benign neoplasm of transverse colon: Secondary | ICD-10-CM

## 2017-05-10 DIAGNOSIS — Z1211 Encounter for screening for malignant neoplasm of colon: Secondary | ICD-10-CM | POA: Diagnosis not present

## 2017-05-10 DIAGNOSIS — K6389 Other specified diseases of intestine: Secondary | ICD-10-CM | POA: Diagnosis not present

## 2017-05-10 DIAGNOSIS — K635 Polyp of colon: Secondary | ICD-10-CM

## 2017-05-10 DIAGNOSIS — Z86010 Personal history of colonic polyps: Secondary | ICD-10-CM | POA: Diagnosis present

## 2017-05-10 DIAGNOSIS — D125 Benign neoplasm of sigmoid colon: Secondary | ICD-10-CM

## 2017-05-10 DIAGNOSIS — D3615 Benign neoplasm of peripheral nerves and autonomic nervous system of abdomen: Secondary | ICD-10-CM | POA: Diagnosis not present

## 2017-05-10 MED ORDER — SODIUM CHLORIDE 0.9 % IV SOLN: 500.0000 mL | INTRAVENOUS | Status: DC

## 2017-05-10 NOTE — Progress Notes (Signed)
Pt's states no medical or surgical changes since previsit or office visit. 

## 2017-05-10 NOTE — Op Note (Signed)
[facility] Patient Name: Ruth Shields Procedure Date: 05/10/2017 10:22 AM MRN: [id number] Endoscopist: Ladene Artist , MD Age: 71 Referring MD:  Date of Birth: 08-19-1946 Gender: Female Account #: [id number] Procedure:                Colonoscopy Indications:              Surveillance: Personal history of adenomatous                            polyps on last colonoscopy 5 years ago Medicines:                Monitored Anesthesia Care Procedure:                Pre-Anesthesia Assessment:                           - Prior to the procedure, a History and Physical                            was performed, and patient medications and                            allergies were reviewed. The patient's tolerance of                            previous anesthesia was also reviewed. The risks                            and benefits of the procedure and the sedation                            options and risks were discussed with the patient.                            All questions were answered, and informed consent                            was obtained. Prior Anticoagulants: The patient has                            taken no previous anticoagulant or antiplatelet                            agents. ASA Grade Assessment: II - A patient with                            mild systemic disease. After reviewing the risks                            and benefits, the patient was deemed in                            satisfactory condition to undergo the procedure.  After obtaining informed consent, the colonoscope                            was passed under direct vision. Throughout the                            procedure, the patient's blood pressure, pulse, and                            oxygen saturations were monitored continuously. The                            Model PCF-H190DL [id number]) scope was introduced                            through the anus and  advanced to the the cecum,                            identified by appendiceal orifice and ileocecal                            valve. The ileocecal valve, appendiceal orifice,                            and rectum were photographed. The quality of the                            bowel preparation was good. The colonoscopy was                            performed without difficulty. The patient tolerated                            the procedure well. Scope In: 10:34:01 AM Scope Out: 10:50:27 AM Scope Withdrawal Time: 0 hours 12 minutes 36 seconds  Total Procedure Duration: 0 hours 16 minutes 26 seconds  Findings:                 The perianal and digital rectal examinations were                            normal.                           Four sessile polyps were found in the sigmoid colon                            (2) and transverse colon (2). The polyps were 3 to                            4 mm in size. These polyps were removed with a cold                            biopsy forceps. Resection and retrieval were  complete.                           A few small-mouthed diverticula were found in the                            sigmoid colon. There was no evidence of                            diverticular bleeding.                           A diffuse area of mild melanosis was found in the                            entire colon.                           The exam was otherwise without abnormality on                            direct and retroflexion views. Complications:            No immediate complications. Estimated blood loss:                            None. Estimated Blood Loss:     Estimated blood loss: none. Impression:               - Four 3 to 4 mm polyps in the sigmoid colon and in                            the transverse colon, removed with a cold biopsy                            forceps. Resected and retrieved.                           - Mild  diverticulosis in the sigmoid colon.                           - The examination was otherwise normal on direct                            and retroflexion views. Recommendation:           - Repeat colonoscopy in 5 years for surveillance.                           - Patient has a contact number available for                            emergencies. The signs and symptoms of potential                            delayed complications were discussed with the  patient. Return to normal activities tomorrow.                            Written discharge instructions were provided to the                            patient.                           - Resume previous diet.                           - Continue present medications.                           - Await pathology results. Ladene Artist, MD 05/10/2017 11:01:04 AM This report has been signed electronically.

## 2017-05-10 NOTE — Patient Instructions (Signed)
Impression/Recommendations:  Polyp handout given to patient. Diverticulosis handout given to patient.  Repeat colonoscopy in 5 years for surveillance.  Resume previous diet. Continue present medications. Await pathology results.  YOU HAD AN ENDOSCOPIC PROCEDURE TODAY AT [facility]:   Refer to the procedure report that was given to you for any specific questions about what was found during the examination.  If the procedure report does not answer your questions, please call your gastroenterologist to clarify.  If you requested that your care partner not be given the details of your procedure findings, then the procedure report has been included in a sealed envelope for you to review at your convenience later.  YOU SHOULD EXPECT: Some feelings of bloating in the abdomen. Passage of more gas than usual.  Walking can help get rid of the air that was put into your GI tract during the procedure and reduce the bloating. If you had a lower endoscopy (such as a colonoscopy or flexible sigmoidoscopy) you may notice spotting of blood in your stool or on the toilet paper. If you underwent a bowel prep for your procedure, you may not have a normal bowel movement for a few days.  Please Note:  You might notice some irritation and congestion in your nose or some drainage.  This is from the oxygen used during your procedure.  There is no need for concern and it should clear up in a day or so.  SYMPTOMS TO REPORT IMMEDIATELY:   Following lower endoscopy (colonoscopy or flexible sigmoidoscopy):  Excessive amounts of blood in the stool  Significant tenderness or worsening of abdominal pains  Swelling of the abdomen that is new, acute  Fever of 100F or higher For urgent or emergent issues, a gastroenterologist can be reached at any hour by calling [phone].   DIET:  We do recommend a small meal at first, but then you may proceed to your regular diet.  Drink plenty of fluids but you  should avoid alcoholic beverages for 24 hours.  ACTIVITY:  You should plan to take it easy for the rest of today and you should NOT DRIVE or use heavy machinery until tomorrow (because of the sedation medicines used during the test).    FOLLOW UP: Our staff will call the number listed on your records the next business day following your procedure to check on you and address any questions or concerns that you may have regarding the information given to you following your procedure. If we do not reach you, we will leave a message.  However, if you are feeling well and you are not experiencing any problems, there is no need to return our call.  We will assume that you have returned to your regular daily activities without incident.  If any biopsies were taken you will be contacted by phone or by letter within the next 1-3 weeks.  Please call us at [phone] if you have not heard about the biopsies in 3 weeks.    SIGNATURES/CONFIDENTIALITY: You and/or your care partner have signed paperwork which will be entered into your electronic medical record.  These signatures attest to the fact that that the information above on your After Visit Summary has been reviewed and is understood.  Full responsibility of the confidentiality of this discharge information lies with you and/or your care-partner.

## 2017-05-10 NOTE — Progress Notes (Signed)
Called to room to assist during endoscopic procedure.  Patient ID and intended procedure confirmed with present staff. Received instructions for my participation in the procedure from the performing physician.  

## 2017-05-10 NOTE — Progress Notes (Signed)
A and O x3. Report to RN. Tolerated MAC anesthesia well.

## 2017-05-13 ENCOUNTER — Telehealth: Payer: Self-pay | Admitting: *Deleted

## 2017-05-13 NOTE — Telephone Encounter (Signed)
  Follow up Call-  Call back number 05/10/2017  Post procedure Call Back phone  # [phone]  Permission to leave phone message Yes  Some recent data might be hidden     Patient questions:  Do you have a fever, pain , or abdominal swelling? No. Pain Score  0 *  Have you tolerated food without any problems? Yes.    Have you been able to return to your normal activities? Yes.    Do you have any questions about your discharge instructions: Diet   No. Medications  No. Follow up visit  No.  Do you have questions or concerns about your Care? No.  Actions: * If pain score is 4 or above: No action needed, pain <4.

## 2017-05-21 DIAGNOSIS — I209 Angina pectoris, unspecified: Secondary | ICD-10-CM | POA: Diagnosis not present

## 2017-05-21 DIAGNOSIS — I712 Thoracic aortic aneurysm, without rupture: Secondary | ICD-10-CM | POA: Diagnosis not present

## 2017-05-21 DIAGNOSIS — E785 Hyperlipidemia, unspecified: Secondary | ICD-10-CM | POA: Diagnosis not present

## 2017-05-21 DIAGNOSIS — D3502 Benign neoplasm of left adrenal gland: Secondary | ICD-10-CM | POA: Diagnosis not present

## 2017-05-21 DIAGNOSIS — K7689 Other specified diseases of liver: Secondary | ICD-10-CM | POA: Diagnosis not present

## 2017-05-21 DIAGNOSIS — I080 Rheumatic disorders of both mitral and aortic valves: Secondary | ICD-10-CM | POA: Diagnosis not present

## 2017-05-21 DIAGNOSIS — R0789 Other chest pain: Secondary | ICD-10-CM | POA: Diagnosis not present

## 2017-05-21 DIAGNOSIS — R079 Chest pain, unspecified: Secondary | ICD-10-CM | POA: Diagnosis not present

## 2017-05-21 DIAGNOSIS — J439 Emphysema, unspecified: Secondary | ICD-10-CM | POA: Diagnosis not present

## 2017-05-21 DIAGNOSIS — I10 Essential (primary) hypertension: Secondary | ICD-10-CM | POA: Diagnosis not present

## 2017-05-21 DIAGNOSIS — K219 Gastro-esophageal reflux disease without esophagitis: Secondary | ICD-10-CM | POA: Diagnosis not present

## 2017-05-21 DIAGNOSIS — J479 Bronchiectasis, uncomplicated: Secondary | ICD-10-CM | POA: Diagnosis not present

## 2017-05-21 DIAGNOSIS — R072 Precordial pain: Secondary | ICD-10-CM | POA: Diagnosis not present

## 2017-05-21 DIAGNOSIS — N281 Cyst of kidney, acquired: Secondary | ICD-10-CM | POA: Diagnosis not present

## 2017-05-22 DIAGNOSIS — I10 Essential (primary) hypertension: Secondary | ICD-10-CM | POA: Diagnosis not present

## 2017-05-22 DIAGNOSIS — R0789 Other chest pain: Secondary | ICD-10-CM | POA: Diagnosis not present

## 2017-05-22 DIAGNOSIS — I209 Angina pectoris, unspecified: Secondary | ICD-10-CM | POA: Diagnosis not present

## 2017-05-22 DIAGNOSIS — R079 Chest pain, unspecified: Secondary | ICD-10-CM | POA: Diagnosis not present

## 2017-05-23 ENCOUNTER — Encounter: Payer: Self-pay | Admitting: Family Medicine

## 2017-05-23 ENCOUNTER — Encounter: Payer: Self-pay | Admitting: Gastroenterology

## 2017-05-23 DIAGNOSIS — I712 Thoracic aortic aneurysm, without rupture: Secondary | ICD-10-CM | POA: Insufficient documentation

## 2017-05-23 DIAGNOSIS — I7121 Aneurysm of the ascending aorta, without rupture: Secondary | ICD-10-CM | POA: Insufficient documentation

## 2017-05-24 ENCOUNTER — Encounter: Payer: Self-pay | Admitting: Cardiovascular Disease

## 2017-05-24 ENCOUNTER — Ambulatory Visit (INDEPENDENT_AMBULATORY_CARE_PROVIDER_SITE_OTHER): Payer: Medicare Other | Admitting: Cardiovascular Disease

## 2017-05-24 VITALS — BP 106/78 | HR 67 | Ht 65.0 in | Wt 143.4 lb

## 2017-05-24 DIAGNOSIS — R9439 Abnormal result of other cardiovascular function study: Secondary | ICD-10-CM

## 2017-05-24 NOTE — Progress Notes (Signed)
Cardiology Office Note Date:  05/24/2017   ID:  Ruth Shields, DOB 1946/01/20, MRN [id number]  PCP:  Marin Olp, MD  Cardiologist:  Sherren Mocha, MD    Chief Complaint  Patient presents with  . Chest Pain     History of Present Illness: Ruth Shields is a 71 y.o. female who presents for evaluation of chest pain. She has been followed in the past for heart palpitations. The patient has hypertension that is been well controlled on antihypertensive therapy. She has otherwise been quite healthy and exercises regularly with no exertional symptoms.  She was sitting at rest watching her grandchildren swim and developed severe left-sided chest pain 3 days ago. Symptoms lasted a few minutes and were followed by diaphoresis, nausea, and lightheadedness. She did not have frank syncope. Symptoms slowly resolved and have not recurred. The patient's chest pain was nonradiating. There was no associated shortness of breath. She was hospitalized for observation and ruled out for MI. She had a stress echo the next day which was reportedly abnormal. She did not experience any symptoms on the treadmill. I do not have a report of the stress echo but the patient states that she was felt to have distal LAD territory ischemia. Her husband is a physician and he had contacted me, stating that 3 different cardiologists reviewed her stress echo and all agreed it was abnormal. The patient is here with her son today who is a heart failure specialist at [facility].   Past Medical History:  Diagnosis Date  . Cancer ([place])   . Hypertension   . Premature ventricular contractions (PVCs) (VPCs)     Past Surgical History:  Procedure Laterality Date  . BREAST BIOPSY     x2- benign  . COLONOSCOPY    . COLONOSCOPY W/ BIOPSIES AND POLYPECTOMY  2004   Dr. Cristal Ford, [place]   . HERNIA REPAIR     left  . KNEE ARTHROSCOPY     left and right- torn meniscus- [facility]  . POLYPECTOMY    . SKIN  CANCER EXCISION     Dr. Delman Cheadle    Current Outpatient Prescriptions  Medication Sig Dispense Refill  . aspirin 81 MG EC tablet Take 81 mg by mouth daily. Swallow whole.    . calcium carbonate (OS-CAL) 1250 (500 Ca) MG chewable tablet Chew 1 tablet by mouth daily.    . diclofenac (VOLTAREN) 75 MG EC tablet Take 75 mg by mouth 2 (two) times daily as needed (headaches).     Marland Kitchen estradiol (VIVELLE-DOT) 0.0375 MG/24HR Place 1 patch onto the skin 2 (two) times a week.    . Multiple Vitamins-Minerals (MULTIVITAMIN WITH MINERALS) tablet Take 1 tablet by mouth daily.    . pantoprazole (PROTONIX) 40 MG tablet Take 40 mg by mouth daily.    . progesterone (PROMETRIUM) 100 MG capsule Take 100 mg by mouth daily.    . rosuvastatin (CRESTOR) 5 MG tablet Take 5 mg by mouth daily.    Marland Kitchen senna (SENOKOT) 8.6 MG tablet Take 1 tablet by mouth daily.    Marland Kitchen spironolactone (ALDACTONE) 25 MG tablet Take 1 tablet (25 mg total) by mouth daily. 91 tablet 3  . valsartan-hydrochlorothiazide (DIOVAN-HCT) 80-12.5 MG tablet Take 1 tablet by mouth daily. 91 tablet 3  . Vitamin D, Ergocalciferol, (DRISDOL) 50000 UNITS CAPS capsule Take 50,000 Units by mouth every 7 (seven) days.     Current Facility-Administered Medications  Medication Dose Route Frequency Provider Last Rate Last Dose  .  0.9 %  sodium chloride infusion  500 mL Intravenous Continuous Ladene Artist, MD        Allergies:   Patient has no known allergies.   Social History:  The patient  reports that she has never smoked. She has never used smokeless tobacco. She reports that she drinks about 4.2 oz of alcohol per week . She reports that she does not use drugs.   Family History:  The patient's  family history includes Atrial fibrillation in her mother; Cancer in her father; Healthy in her brother; Hypertension in her father; Other in her brother; Parkinson's disease in her father.    ROS:  Please see the history of present illness.  All other systems are  reviewed and negative.    PHYSICAL EXAM: VS:  BP 106/78   Pulse 67   Ht 5\' 5"  (1.651 m)   Wt 143 lb 6.4 oz (65 kg)   BMI 23.86 kg/m  , BMI Body mass index is 23.86 kg/m. GEN: Well nourished, well developed, in no acute distress  HEENT: normal  Neck: no JVD, no masses. No carotid bruits Cardiac: RRR without murmur or gallop                Respiratory:  clear to auscultation bilaterally, normal work of breathing GI: soft, nontender, nondistended, + BS MS: no deformity or atrophy  Ext: no pretibial edema, pedal pulses 2+= bilaterally Skin: warm and dry, no rash Neuro:  Strength and sensation are intact Psych: euthymic mood, full affect  EKG:  EKG is ordered today. The ekg ordered today shows normal sinus rhythm, within normal limits.  Recent Labs: 11/09/2016: ALT 21; BUN 18; Creatinine 0.8; Hemoglobin 12.4; Platelets 228; Potassium 3.9; Sodium 137; TSH 1.14   Lipid Panel     Component Value Date/Time   CHOL 225 (A) 04/29/2014   TRIG 42 04/29/2014   HDL 101 (A) 04/29/2014   LDLCALC 116 04/29/2014      Wt Readings from Last 3 Encounters:  05/24/17 143 lb 6.4 oz (65 kg)  05/10/17 144 lb (65.3 kg)  04/24/17 144 lb 12.8 oz (65.7 kg)     Cardiac Studies Reviewed: 2D Echo 08/17/2014: Study Conclusions  - Left ventricle: The cavity size was normal. Wall thickness was normal. Systolic function was normal. The estimated ejection fraction was in the range of 55% to 60%. Wall motion was normal; there were no regional wall motion abnormalities. Doppler parameters are consistent with abnormal left ventricular relaxation (grade 1 diastolic dysfunction). - Aortic valve: There was mild regurgitation. - Mitral valve: There was mild regurgitation.  Impressions:  - Normal LV function; grade 1 diastolic dysfunction; mild AI, MR and TR.  ASSESSMENT AND PLAN: 1.  Chest pain with abnormal stress test: I reviewed the possibilities of further diagnostic testing with the  patient and her son today at length. Could consider further noninvasive imaging testing such as an exercise perfusion scan or a coronary CTA versus invasive cardiac catheterization for definitive evaluation. After reviewing the pros and cons of both approaches, we elected to proceed with cardiac catheterization and possible PTCA/stenting. The patient has already had a stress echocardiogram that was abnormal. We have sent for the formal interpretation by reportedly there is an apical abnormality concerning for LAD territory ischemia. I have reviewed the risks, indications, and alternatives to cardiac catheterization, possible angioplasty, and stenting with the patient. Risks include but are not limited to bleeding, infection, vascular injury, stroke, myocardial infection, arrhythmia, kidney injury, radiation-related injury  in the case of prolonged fluoroscopy use, emergency cardiac surgery, and death. The patient understands the risks of serious complication is 1-2 in 8138 with diagnostic cardiac cath and 1-2% or less with angioplasty/stenting. She will continue aspirin 81 mg and Crestor 5 mg daily.  2. Dilated ascending aorta. The patient had a CT scan during her recent evaluation which demonstrated no evidence of pulmonary embolism but there was aneurysmal dilatation of the ascending aorta measuring up to 4.3 cm. I am going to load the scan and do our system and asked Dr. Weber Cooks to review the images. She will likely need serial imaging follow-up and I would favor MRA to avoid repeated radiation exposure. Will tentatively plan a chest MRA in 6 months but await Dr. Jonnie Kind review of her CT scan. Etiology of her dilated aorta may be long-standing hypertension even know her blood pressure seems to be well-controlled at this time.  3. Hyperlipidemia: Continue Crestor 5 mg daily  4. Hypertension: Blood pressure is well controlled on a combination of valsartan, hydrochlorothiazide, and  spironolactone.  Current medicines are reviewed with the patient today.  The patient does not have concerns regarding medicines.  Labs/ tests ordered today include:   Orders Placed This Encounter  Procedures  . EKG 12-Lead    Disposition:   FU pending test results  Signed, Sherren Mocha, MD  05/24/2017 11:43 AM    [facility] [place], [place], [place]  [address] Phone: [phone]; Fax: [phone]

## 2017-05-24 NOTE — Patient Instructions (Signed)
Medication Instructions:  Your physician recommends that you continue on your current medications as directed. Please refer to the Current Medication list given to you today.  Labwork: No new orders.   Testing/Procedures: Your physician has requested that you have a cardiac catheterization. Cardiac catheterization is used to diagnose and/or treat various heart conditions. Doctors may recommend this procedure for a number of different reasons. The most common reason is to evaluate chest pain. Chest pain can be a symptom of coronary artery disease (CAD), and cardiac catheterization can show whether plaque is narrowing or blocking your heart's arteries. This procedure is also used to evaluate the valves, as well as measure the blood flow and oxygen levels in different parts of your heart. For further information please visit [facility]. Please follow instruction sheet, as given.    [place] OFF[facility] [address], [address] [place] [address] Dept: [phone] Loc: [phone]  Ruth Shields  05/24/2017  You are scheduled for a Cardiac Catheterization on Tuesday, June 19 with Dr. Sherren Mocha.  1. Please arrive at the [facility] (Main Entrance A) at [facility]: [place], [place] [address] at 8:30 AM (two hours before your procedure to ensure your preparation). Free valet parking service is available.   Special note: Every effort is made to have your procedure done on time. Please understand that emergencies sometimes delay scheduled procedures.  2. Diet: Do not eat or drink anything after midnight prior to your procedure except sips of water to take medications.  3. Labs: None needed.  4. Medication instructions in preparation for your procedure:  On the morning of your procedure, take your Aspirin and any morning medicines NOT listed above.  You may use sips of  water.  5. Plan for one night stay--bring personal belongings.  6. Bring a current list of your medications and current insurance cards.  7. You MUST have a responsible person to drive you home.  8. Someone MUST be with you the first 24 hours after you arrive home or your discharge will be delayed.  9. Please wear clothes that are easy to get on and off and wear slip-on shoes.  Thank you for allowing [place] to care for you!   -- [facility]  Follow-Up: We will arrange further follow-up after your cardiac catheterization.   Any Other Special Instructions Will Be Listed Below (If Applicable).     If you need a refill on your cardiac medications before your next appointment, please call your pharmacy.

## 2017-05-27 ENCOUNTER — Telehealth: Payer: Self-pay

## 2017-05-27 NOTE — Telephone Encounter (Signed)
Patient contacted pre-catheterization at [facility] scheduled for: 05/28/2017 @ 1030 Verified arrival time and place: Gave direction to [facility] and confirmed arrival time of 0830 Confirmed AM meds to be taken pre-cath with sip of water:  Per Pt, she takes her ASA and Prilosec in am Confirmed patient has responsible person to drive home post procedure and observe patient for 24 hours: husband Addl concerns:  Pt asked if she could wear her contacts.  Stated that would be fine.  No further concerns.

## 2017-05-28 ENCOUNTER — Ambulatory Visit (HOSPITAL_COMMUNITY)
Admission: RE | Admit: 2017-05-28 | Discharge: 2017-05-28 | Disposition: A | Discharge status: Home / Self Care | Payer: Medicare Other | Source: Ambulatory Visit | Attending: Cardiovascular Disease | Admitting: Cardiovascular Disease

## 2017-05-28 ENCOUNTER — Encounter (HOSPITAL_COMMUNITY)
Admission: RE | Disposition: A | Discharge status: Home / Self Care | Payer: Self-pay | Source: Ambulatory Visit | Attending: Cardiovascular Disease

## 2017-05-28 DIAGNOSIS — I493 Ventricular premature depolarization: Secondary | ICD-10-CM | POA: Diagnosis not present

## 2017-05-28 DIAGNOSIS — Z7982 Long term (current) use of aspirin: Secondary | ICD-10-CM | POA: Diagnosis not present

## 2017-05-28 DIAGNOSIS — R9439 Abnormal result of other cardiovascular function study: Secondary | ICD-10-CM | POA: Diagnosis present

## 2017-05-28 DIAGNOSIS — I712 Thoracic aortic aneurysm, without rupture: Secondary | ICD-10-CM | POA: Insufficient documentation

## 2017-05-28 DIAGNOSIS — I10 Essential (primary) hypertension: Secondary | ICD-10-CM | POA: Insufficient documentation

## 2017-05-28 DIAGNOSIS — R0789 Other chest pain: Secondary | ICD-10-CM | POA: Insufficient documentation

## 2017-05-28 DIAGNOSIS — I2510 Atherosclerotic heart disease of native coronary artery without angina pectoris: Secondary | ICD-10-CM | POA: Insufficient documentation

## 2017-05-28 DIAGNOSIS — E785 Hyperlipidemia, unspecified: Secondary | ICD-10-CM | POA: Diagnosis not present

## 2017-05-28 HISTORY — PX: LEFT HEART CATH AND CORONARY ANGIOGRAPHY: CATH118249

## 2017-05-28 LAB — PROTIME-INR
INR: 1.03
PROTHROMBIN TIME: 13.5 s (ref 11.4–15.2)

## 2017-05-28 LAB — CBC
HCT: 35.7 % — ABNORMAL LOW (ref 36.0–46.0)
HEMOGLOBIN: 12 g/dL (ref 12.0–15.0)
MCH: 31.4 pg (ref 26.0–34.0)
MCHC: 33.6 g/dL (ref 30.0–36.0)
MCV: 93.5 fL (ref 78.0–100.0)
Platelets: 198 10*3/uL (ref 150–400)
RBC: 3.82 MIL/uL — ABNORMAL LOW (ref 3.87–5.11)
RDW: 12.9 % (ref 11.5–15.5)
WBC: 4.6 10*3/uL (ref 4.0–10.5)

## 2017-05-28 LAB — BASIC METABOLIC PANEL
Anion gap: 8 (ref 5–15)
BUN: 15 mg/dL (ref 6–20)
CALCIUM: 9.3 mg/dL (ref 8.9–10.3)
CHLORIDE: 105 mmol/L (ref 101–111)
CO2: 25 mmol/L (ref 22–32)
CREATININE: 0.84 mg/dL (ref 0.44–1.00)
GFR calc non Af Amer: >60 mL/min (ref >60)
Glucose, Bld: 95 mg/dL (ref 65–99)
Potassium: 3.6 mmol/L (ref 3.5–5.1)
SODIUM: 138 mmol/L (ref 135–145)

## 2017-05-28 SURGERY — LEFT HEART CATH AND CORONARY ANGIOGRAPHY
Anesthesia: LOCAL

## 2017-05-28 MED ORDER — LIDOCAINE HCL 1 % IJ SOLN
INTRAMUSCULAR | Status: AC
  Filled 2017-05-28: qty 20

## 2017-05-28 MED ORDER — HEPARIN (PORCINE) IN NACL 2-0.9 UNIT/ML-% IJ SOLN
INTRAMUSCULAR | Status: DC | PRN
Start: 2017-05-28 — End: 2017-05-28
  Administered 2017-05-28: 12:00:00

## 2017-05-28 MED ORDER — IOPAMIDOL (ISOVUE-370) INJECTION 76%
INTRAVENOUS | Status: AC
Start: 2017-05-28 — End: ?
  Filled 2017-05-28: qty 100

## 2017-05-28 MED ORDER — FENTANYL CITRATE (PF) 100 MCG/2ML IJ SOLN
INTRAMUSCULAR | Status: AC
  Filled 2017-05-28: qty 2

## 2017-05-28 MED ORDER — MIDAZOLAM HCL 2 MG/2ML IJ SOLN
INTRAMUSCULAR | Status: DC | PRN
  Administered 2017-05-28: 2 mg via INTRAVENOUS
  Administered 2017-05-28: 1 mg via INTRAVENOUS

## 2017-05-28 MED ORDER — VERAPAMIL HCL 2.5 MG/ML IV SOLN
INTRAVENOUS | Status: AC
  Filled 2017-05-28: qty 2

## 2017-05-28 MED ORDER — SODIUM CHLORIDE 0.9% FLUSH: 3.0000 mL | INTRAVENOUS | Status: DC | PRN

## 2017-05-28 MED ORDER — ASPIRIN 81 MG PO CHEW: 81.0000 mg | CHEWABLE_TABLET | ORAL | Status: DC

## 2017-05-28 MED ORDER — MIDAZOLAM HCL 2 MG/2ML IJ SOLN
INTRAMUSCULAR | Status: AC
  Filled 2017-05-28: qty 2

## 2017-05-28 MED ORDER — SODIUM CHLORIDE 0.9 % WEIGHT BASED INFUSION
3.0000 mL/kg/h | INTRAVENOUS | Status: AC
  Administered 2017-05-28: 3 mL/kg/h via INTRAVENOUS

## 2017-05-28 MED ORDER — HEPARIN (PORCINE) IN NACL 2-0.9 UNIT/ML-% IJ SOLN
INTRAMUSCULAR | Status: AC
  Filled 2017-05-28: qty 1000

## 2017-05-28 MED ORDER — HEPARIN SODIUM (PORCINE) 1000 UNIT/ML IJ SOLN
INTRAMUSCULAR | Status: DC | PRN
  Administered 2017-05-28: 4000 [IU] via INTRAVENOUS

## 2017-05-28 MED ORDER — FENTANYL CITRATE (PF) 100 MCG/2ML IJ SOLN
INTRAMUSCULAR | Status: DC | PRN
  Administered 2017-05-28 (×2): 25 ug via INTRAVENOUS

## 2017-05-28 MED ORDER — LIDOCAINE HCL (PF) 1 % IJ SOLN
INTRAMUSCULAR | Status: DC | PRN
  Administered 2017-05-28: 2 mL

## 2017-05-28 MED ORDER — SODIUM CHLORIDE 0.9 % IV SOLN: 250.0000 mL | INTRAVENOUS | Status: DC | PRN

## 2017-05-28 MED ORDER — IOPAMIDOL (ISOVUE-370) INJECTION 76%
INTRAVENOUS | Status: DC | PRN
  Administered 2017-05-28: 30 mL via INTRAVENOUS

## 2017-05-28 MED ORDER — HEPARIN SODIUM (PORCINE) 1000 UNIT/ML IJ SOLN
INTRAMUSCULAR | Status: AC
  Filled 2017-05-28: qty 1

## 2017-05-28 MED ORDER — SODIUM CHLORIDE 0.9 % WEIGHT BASED INFUSION: 1.0000 mL/kg/h | INTRAVENOUS | Status: DC

## 2017-05-28 MED ORDER — SODIUM CHLORIDE 0.9% FLUSH: 3.0000 mL | Freq: Two times a day (BID) | INTRAVENOUS | Status: DC

## 2017-05-28 MED ORDER — VERAPAMIL HCL 2.5 MG/ML IV SOLN
INTRAVENOUS | Status: DC | PRN
  Administered 2017-05-28: 10 mL via INTRA_ARTERIAL

## 2017-05-28 SURGICAL SUPPLY — 10 items
CATH EXPO 5F FL3.5 (CATHETERS) ×2 IMPLANT
CATH INFINITI JR4 5F (CATHETERS) ×2 IMPLANT
DEVICE RAD COMP TR BAND LRG (VASCULAR PRODUCTS) ×2 IMPLANT
GLIDESHEATH SLEND SS 6F .021 (SHEATH) ×2 IMPLANT
GUIDEWIRE INQWIRE 1.5J.035X260 (WIRE) ×1 IMPLANT
INQWIRE 1.5J .035X260CM (WIRE) ×2
KIT HEART LEFT (KITS) ×2 IMPLANT
PACK CARDIAC CATHETERIZATION (CUSTOM PROCEDURE TRAY) ×2 IMPLANT
TRANSDUCER W/STOPCOCK (MISCELLANEOUS) ×2 IMPLANT
TUBING CIL FLEX 10 FLL-RA (TUBING) ×2 IMPLANT

## 2017-05-28 NOTE — Discharge Instructions (Signed)

## 2017-05-28 NOTE — H&P (View-Only) (Signed)
Cardiology Office Note Date:  05/24/2017   ID:  Carollyn Etcheverry, DOB 10-26-1946, MRN [id number]  PCP:  Marin Olp, MD  Cardiologist:  Sherren Mocha, MD    Chief Complaint  Patient presents with  . Chest Pain     History of Present Illness: Ruth Shields is a 71 y.o. female who presents for evaluation of chest pain. She has been followed in the past for heart palpitations. The patient has hypertension that is been well controlled on antihypertensive therapy. She has otherwise been quite healthy and exercises regularly with no exertional symptoms.  She was sitting at rest watching her grandchildren swim and developed severe left-sided chest pain 3 days ago. Symptoms lasted a few minutes and were followed by diaphoresis, nausea, and lightheadedness. She did not have frank syncope. Symptoms slowly resolved and have not recurred. The patient's chest pain was nonradiating. There was no associated shortness of breath. She was hospitalized for observation and ruled out for MI. She had a stress echo the next day which was reportedly abnormal. She did not experience any symptoms on the treadmill. I do not have a report of the stress echo but the patient states that she was felt to have distal LAD territory ischemia. Her husband is a physician and he had contacted me, stating that 3 different cardiologists reviewed her stress echo and all agreed it was abnormal. The patient is here with her son today who is a heart failure specialist at [facility].   Past Medical History:  Diagnosis Date  . Cancer ([place])   . Hypertension   . Premature ventricular contractions (PVCs) (VPCs)     Past Surgical History:  Procedure Laterality Date  . BREAST BIOPSY     x2- benign  . COLONOSCOPY    . COLONOSCOPY W/ BIOPSIES AND POLYPECTOMY  2004   Dr. Cristal Ford, [place]   . HERNIA REPAIR     left  . KNEE ARTHROSCOPY     left and right- torn meniscus- [facility]  . POLYPECTOMY    . SKIN  CANCER EXCISION     Dr. Delman Cheadle    Current Outpatient Prescriptions  Medication Sig Dispense Refill  . aspirin 81 MG EC tablet Take 81 mg by mouth daily. Swallow whole.    . calcium carbonate (OS-CAL) 1250 (500 Ca) MG chewable tablet Chew 1 tablet by mouth daily.    . diclofenac (VOLTAREN) 75 MG EC tablet Take 75 mg by mouth 2 (two) times daily as needed (headaches).     Marland Kitchen estradiol (VIVELLE-DOT) 0.0375 MG/24HR Place 1 patch onto the skin 2 (two) times a week.    . Multiple Vitamins-Minerals (MULTIVITAMIN WITH MINERALS) tablet Take 1 tablet by mouth daily.    . pantoprazole (PROTONIX) 40 MG tablet Take 40 mg by mouth daily.    . progesterone (PROMETRIUM) 100 MG capsule Take 100 mg by mouth daily.    . rosuvastatin (CRESTOR) 5 MG tablet Take 5 mg by mouth daily.    Marland Kitchen senna (SENOKOT) 8.6 MG tablet Take 1 tablet by mouth daily.    Marland Kitchen spironolactone (ALDACTONE) 25 MG tablet Take 1 tablet (25 mg total) by mouth daily. 91 tablet 3  . valsartan-hydrochlorothiazide (DIOVAN-HCT) 80-12.5 MG tablet Take 1 tablet by mouth daily. 91 tablet 3  . Vitamin D, Ergocalciferol, (DRISDOL) 50000 UNITS CAPS capsule Take 50,000 Units by mouth every 7 (seven) days.     Current Facility-Administered Medications  Medication Dose Route Frequency Provider Last Rate Last Dose  .  0.9 %  sodium chloride infusion  500 mL Intravenous Continuous Ladene Artist, MD        Allergies:   Patient has no known allergies.   Social History:  The patient  reports that she has never smoked. She has never used smokeless tobacco. She reports that she drinks about 4.2 oz of alcohol per week . She reports that she does not use drugs.   Family History:  The patient's  family history includes Atrial fibrillation in her mother; Cancer in her father; Healthy in her brother; Hypertension in her father; Other in her brother; Parkinson's disease in her father.    ROS:  Please see the history of present illness.  All other systems are  reviewed and negative.    PHYSICAL EXAM: VS:  BP 106/78   Pulse 67   Ht 5\' 5"  (1.651 m)   Wt 143 lb 6.4 oz (65 kg)   BMI 23.86 kg/m  , BMI Body mass index is 23.86 kg/m. GEN: Well nourished, well developed, in no acute distress  HEENT: normal  Neck: no JVD, no masses. No carotid bruits Cardiac: RRR without murmur or gallop                Respiratory:  clear to auscultation bilaterally, normal work of breathing GI: soft, nontender, nondistended, + BS MS: no deformity or atrophy  Ext: no pretibial edema, pedal pulses 2+= bilaterally Skin: warm and dry, no rash Neuro:  Strength and sensation are intact Psych: euthymic mood, full affect  EKG:  EKG is ordered today. The ekg ordered today shows normal sinus rhythm, within normal limits.  Recent Labs: 11/09/2016: ALT 21; BUN 18; Creatinine 0.8; Hemoglobin 12.4; Platelets 228; Potassium 3.9; Sodium 137; TSH 1.14   Lipid Panel     Component Value Date/Time   CHOL 225 (A) 04/29/2014   TRIG 42 04/29/2014   HDL 101 (A) 04/29/2014   LDLCALC 116 04/29/2014      Wt Readings from Last 3 Encounters:  05/24/17 143 lb 6.4 oz (65 kg)  05/10/17 144 lb (65.3 kg)  04/24/17 144 lb 12.8 oz (65.7 kg)     Cardiac Studies Reviewed: 2D Echo 08/17/2014: Study Conclusions  - Left ventricle: The cavity size was normal. Wall thickness was normal. Systolic function was normal. The estimated ejection fraction was in the range of 55% to 60%. Wall motion was normal; there were no regional wall motion abnormalities. Doppler parameters are consistent with abnormal left ventricular relaxation (grade 1 diastolic dysfunction). - Aortic valve: There was mild regurgitation. - Mitral valve: There was mild regurgitation.  Impressions:  - Normal LV function; grade 1 diastolic dysfunction; mild AI, MR and TR.  ASSESSMENT AND PLAN: 1.  Chest pain with abnormal stress test: I reviewed the possibilities of further diagnostic testing with the  patient and her son today at length. Could consider further noninvasive imaging testing such as an exercise perfusion scan or a coronary CTA versus invasive cardiac catheterization for definitive evaluation. After reviewing the pros and cons of both approaches, we elected to proceed with cardiac catheterization and possible PTCA/stenting. The patient has already had a stress echocardiogram that was abnormal. We have sent for the formal interpretation by reportedly there is an apical abnormality concerning for LAD territory ischemia. I have reviewed the risks, indications, and alternatives to cardiac catheterization, possible angioplasty, and stenting with the patient. Risks include but are not limited to bleeding, infection, vascular injury, stroke, myocardial infection, arrhythmia, kidney injury, radiation-related injury  in the case of prolonged fluoroscopy use, emergency cardiac surgery, and death. The patient understands the risks of serious complication is 1-2 in 7622 with diagnostic cardiac cath and 1-2% or less with angioplasty/stenting. She will continue aspirin 81 mg and Crestor 5 mg daily.  2. Dilated ascending aorta. The patient had a CT scan during her recent evaluation which demonstrated no evidence of pulmonary embolism but there was aneurysmal dilatation of the ascending aorta measuring up to 4.3 cm. I am going to load the scan and do our system and asked Dr. Weber Cooks to review the images. She will likely need serial imaging follow-up and I would favor MRA to avoid repeated radiation exposure. Will tentatively plan a chest MRA in 6 months but await Dr. Jonnie Kind review of her CT scan. Etiology of her dilated aorta may be long-standing hypertension even know her blood pressure seems to be well-controlled at this time.  3. Hyperlipidemia: Continue Crestor 5 mg daily  4. Hypertension: Blood pressure is well controlled on a combination of valsartan, hydrochlorothiazide, and  spironolactone.  Current medicines are reviewed with the patient today.  The patient does not have concerns regarding medicines.  Labs/ tests ordered today include:   Orders Placed This Encounter  Procedures  . EKG 12-Lead    Disposition:   FU pending test results  Signed, Sherren Mocha, MD  05/24/2017 11:43 AM    [facility] [place], [place], [place]  [address] Phone: [phone]; Fax: [phone]

## 2017-05-28 NOTE — Interval H&P Note (Signed)
History and Physical Interval Note:  05/28/2017 11:58 AM  Ruth Shields  has presented today for surgery, with the diagnosis of abnormal stress echo  The various methods of treatment have been discussed with the patient and family. After consideration of risks, benefits and other options for treatment, the patient has consented to  Procedure(s): Left Heart Cath and Coronary Angiography (N/A) as a surgical intervention .  The patient's history has been reviewed, patient examined, no change in status, stable for surgery.  I have reviewed the patient's chart and labs.  Questions were answered to the patient's satisfaction.     Sherren Mocha

## 2017-05-28 NOTE — Progress Notes (Signed)
Attempted to remove air from TRB/instant hematoma at distal TRB/pressure held for 15 min/ bruise remains but site soft/will reattempt in 15 min. Reported off to [facility]

## 2017-05-29 ENCOUNTER — Encounter (HOSPITAL_COMMUNITY): Payer: Self-pay | Admitting: Cardiovascular Disease

## 2017-07-10 ENCOUNTER — Encounter: Payer: Self-pay | Admitting: Cardiovascular Disease

## 2017-07-10 ENCOUNTER — Other Ambulatory Visit: Payer: Self-pay

## 2017-07-10 DIAGNOSIS — E785 Hyperlipidemia, unspecified: Secondary | ICD-10-CM

## 2017-07-10 DIAGNOSIS — K219 Gastro-esophageal reflux disease without esophagitis: Secondary | ICD-10-CM

## 2017-07-10 MED ORDER — PANTOPRAZOLE SODIUM 40 MG PO TBEC: 40.0000 mg | DELAYED_RELEASE_TABLET | Freq: Every day | ORAL | 3 refills | Status: DC

## 2017-07-10 MED ORDER — ROSUVASTATIN CALCIUM 5 MG PO TABS: 5.0000 mg | ORAL_TABLET | Freq: Every day | ORAL | 3 refills | Status: DC

## 2017-08-13 ENCOUNTER — Other Ambulatory Visit: Payer: Self-pay | Admitting: Obstetrics and Gynecology

## 2017-08-13 DIAGNOSIS — Z1231 Encounter for screening mammogram for malignant neoplasm of breast: Secondary | ICD-10-CM

## 2017-09-03 DIAGNOSIS — Z23 Encounter for immunization: Secondary | ICD-10-CM | POA: Diagnosis not present

## 2017-09-26 ENCOUNTER — Ambulatory Visit
Admission: RE | Admit: 2017-09-26 | Discharge: 2017-09-26 | Disposition: A | Discharge status: Home / Self Care | Payer: Medicare Other | Source: Ambulatory Visit | Attending: Obstetrics and Gynecology | Admitting: Obstetrics and Gynecology

## 2017-09-26 DIAGNOSIS — Z01419 Encounter for gynecological examination (general) (routine) without abnormal findings: Secondary | ICD-10-CM | POA: Diagnosis not present

## 2017-09-26 DIAGNOSIS — Z1231 Encounter for screening mammogram for malignant neoplasm of breast: Secondary | ICD-10-CM | POA: Diagnosis not present

## 2017-09-26 DIAGNOSIS — Z6824 Body mass index (BMI) 24.0-24.9, adult: Secondary | ICD-10-CM | POA: Diagnosis not present

## 2017-09-26 LAB — HM MAMMOGRAPHY

## 2017-10-14 DIAGNOSIS — R918 Other nonspecific abnormal finding of lung field: Secondary | ICD-10-CM | POA: Diagnosis not present

## 2017-11-12 DIAGNOSIS — R5382 Chronic fatigue, unspecified: Secondary | ICD-10-CM | POA: Diagnosis not present

## 2017-11-12 DIAGNOSIS — D485 Neoplasm of uncertain behavior of skin: Secondary | ICD-10-CM | POA: Diagnosis not present

## 2017-11-12 DIAGNOSIS — E559 Vitamin D deficiency, unspecified: Secondary | ICD-10-CM | POA: Diagnosis not present

## 2017-11-12 DIAGNOSIS — R350 Frequency of micturition: Secondary | ICD-10-CM | POA: Diagnosis not present

## 2017-11-12 DIAGNOSIS — Z23 Encounter for immunization: Secondary | ICD-10-CM | POA: Diagnosis not present

## 2017-11-12 DIAGNOSIS — E785 Hyperlipidemia, unspecified: Secondary | ICD-10-CM | POA: Diagnosis not present

## 2017-11-12 DIAGNOSIS — L720 Epidermal cyst: Secondary | ICD-10-CM | POA: Diagnosis not present

## 2017-11-12 DIAGNOSIS — L570 Actinic keratosis: Secondary | ICD-10-CM | POA: Diagnosis not present

## 2017-11-12 LAB — BASIC METABOLIC PANEL
BUN: 21 (ref 4–21)
CREATININE: 0.8 (ref 0.5–1.1)
GLUCOSE: 83
Potassium: 4.2 (ref 3.4–5.3)
Sodium: 139 (ref 137–147)

## 2017-11-12 LAB — LIPID PANEL
Cholesterol: 200 (ref 0–200)
HDL: 86 — AB (ref 35–70)
LDL CALC: 105
Triglycerides: 47 (ref 40–160)

## 2017-11-12 LAB — CBC AND DIFFERENTIAL
Hemoglobin: 12.1 (ref 12.0–16.0)
PLATELETS: 220 (ref 150–399)
WBC: 4.9

## 2017-11-12 LAB — HEPATIC FUNCTION PANEL
ALK PHOS: 39 (ref 25–125)
ALT: 23 (ref 7–35)
AST: 28 (ref 13–35)
BILIRUBIN, TOTAL: 0.3

## 2017-11-12 LAB — VITAMIN D 25 HYDROXY (VIT D DEFICIENCY, FRACTURES): Vit D, 25-Hydroxy: 61

## 2017-11-13 ENCOUNTER — Ambulatory Visit (INDEPENDENT_AMBULATORY_CARE_PROVIDER_SITE_OTHER): Payer: Medicare Other | Admitting: Family Medicine

## 2017-11-13 ENCOUNTER — Encounter: Payer: Self-pay | Admitting: Family Medicine

## 2017-11-13 VITALS — BP 110/76 | HR 72 | Temp 97.6°F | Ht 64.75 in | Wt 145.2 lb

## 2017-11-13 DIAGNOSIS — E785 Hyperlipidemia, unspecified: Secondary | ICD-10-CM

## 2017-11-13 DIAGNOSIS — I712 Thoracic aortic aneurysm, without rupture: Secondary | ICD-10-CM

## 2017-11-13 DIAGNOSIS — I7121 Aneurysm of the ascending aorta, without rupture: Secondary | ICD-10-CM

## 2017-11-13 DIAGNOSIS — Z0000 Encounter for general adult medical examination without abnormal findings: Secondary | ICD-10-CM | POA: Diagnosis not present

## 2017-11-13 DIAGNOSIS — I10 Essential (primary) hypertension: Secondary | ICD-10-CM | POA: Diagnosis not present

## 2017-11-13 DIAGNOSIS — K219 Gastro-esophageal reflux disease without esophagitis: Secondary | ICD-10-CM

## 2017-11-13 NOTE — Assessment & Plan Note (Signed)
GERD- on protonix 40mg - was thought that chest pain episode was vasovagal response to reflux- has not had recurrence- takes 2-3x a week. Discussed potentially using zantac but with severity of prior episode she prefers to remain on sparing protonix

## 2017-11-13 NOTE — Progress Notes (Signed)
Phone: [phone]  Subjective:  Patient presents today for their annual physical. Chief complaint-noted.   See problem oriented charting- ROS- full  review of systems was completed and negative including No chest pain or shortness of breath. No headache or blurry vision.   The following were reviewed and entered/updated in epic: Past Medical History:  Diagnosis Date  . Cancer ([place])   . Hypertension   . Premature ventricular contractions (PVCs) (VPCs)    Patient Active Problem List   Diagnosis Date Noted  . Aneurysm of ascending aorta (HCC) 05/23/2017    Priority: High  . Hyperlipidemia 11/13/2017    Priority: Medium  . Osteoporosis 11/12/2016    Priority: Medium  . Hormone replacement therapy 11/12/2016    Priority: Medium  . Essential hypertension 03/21/2010    Priority: Medium  . GERD (gastroesophageal reflux disease) 11/12/2016    Priority: Low  . Palpitations 07/31/2014    Priority: Low  . MURMUR 03/21/2010    Priority: Low  . Abnormal stress echo 05/28/2017   Past Surgical History:  Procedure Laterality Date  . BREAST BIOPSY     x2- benign  . BREAST EXCISIONAL BIOPSY Right    20 years ago x 2  . COLONOSCOPY    . COLONOSCOPY W/ BIOPSIES AND POLYPECTOMY  2004   Dr. Cristal Ford, [place]   . HERNIA REPAIR     left  . KNEE ARTHROSCOPY     left and right- torn meniscus- [place] [facility]  . LEFT HEART CATH AND CORONARY ANGIOGRAPHY N/A 05/28/2017   Procedure: Left Heart Cath and Coronary Angiography;  Surgeon: Sherren Mocha, MD;  Location: [facility];  Service: Cardiovascular;  Laterality: N/A;  . POLYPECTOMY    . SKIN CANCER EXCISION     Dr. Delman Cheadle    Family History  Problem Relation Age of Onset  . Atrial fibrillation Mother        9 and independent  . Cancer Father        lymphoma  . Hypertension Father   . Parkinson's disease Father        27  . Healthy Brother   . Other Brother        died in [place]  . Colon cancer Neg Hx   .  Stomach cancer Neg Hx   . Rectal cancer Neg Hx   . Esophageal cancer Neg Hx     Medications- reviewed and updated Current Outpatient Medications  Medication Sig Dispense Refill  . aspirin 81 MG EC tablet Take 81 mg by mouth daily. Swallow whole.    . calcium carbonate (OS-CAL) 1250 (500 Ca) MG chewable tablet Chew 1 tablet by mouth daily.    . diclofenac (VOLTAREN) 75 MG EC tablet Take 75 mg by mouth 2 (two) times daily as needed (headaches).     Marland Kitchen estradiol (VIVELLE-DOT) 0.0375 MG/24HR Place 1 patch onto the skin 2 (two) times a week.    . Multiple Vitamins-Minerals (MULTIVITAMIN WITH MINERALS) tablet Take 1 tablet by mouth daily.    . pantoprazole (PROTONIX) 40 MG tablet Take 1 tablet (40 mg total) by mouth daily. 90 tablet 3  . progesterone (PROMETRIUM) 100 MG capsule Take 100 mg by mouth every evening.     . rosuvastatin (CRESTOR) 5 MG tablet Take 1 tablet (5 mg total) by mouth at bedtime. 90 tablet 3  . senna (SENOKOT) 8.6 MG tablet Take 1 tablet by mouth daily.    Marland Kitchen spironolactone (ALDACTONE) 25 MG tablet Take 1 tablet (25  mg total) by mouth daily. (Patient taking differently: Take 25 mg by mouth at bedtime. ) 91 tablet 3  . valsartan-hydrochlorothiazide (DIOVAN-HCT) 80-12.5 MG tablet Take 1 tablet by mouth daily. (Patient taking differently: Take 1 tablet by mouth at bedtime. ) 91 tablet 3  . Vitamin D, Ergocalciferol, (DRISDOL) 50000 UNITS CAPS capsule Take 50,000 Units by mouth every 7 (seven) days.     Current Facility-Administered Medications  Medication Dose Route Frequency Provider Last Rate Last Dose  . 0.9 %  sodium chloride infusion  500 mL Intravenous Continuous Ladene Artist, MD        Allergies-reviewed and updated No Known Allergies  Social History   Socioeconomic History  . Marital status: Married    Spouse name: None  . Number of children: None  . Years of education: None  . Highest education level: None  Social Needs  . Financial resource strain: None    . Food insecurity - worry: None  . Food insecurity - inability: None  . Transportation needs - medical: None  . Transportation needs - non-medical: None  Occupational History  . None  Tobacco Use  . Smoking status: Never Smoker  . Smokeless tobacco: Never Used  Substance and Sexual Activity  . Alcohol use: Yes    Alcohol/week: 4.2 oz    Types: 7 Glasses of wine per week  . Drug use: No  . Sexual activity: None  Other Topics Concern  . None  Social History Narrative   Married. 2 children (55 son with 61 sons, 54 year old married- horse and dogs) in 2017.    Lives in [place].       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 Oropharynx normal Neck: no thyromegaly CV: RRR no murmurs rubs or gallops. I did not detect prior heard murmur today Lungs: CTAB no crackles, wheeze, rhonchi Abdomen: soft/nontender/nondistended/normal bowel sounds. No rebound or guarding. Healthy weight Ext: no edema Skin: warm, dry Neuro: grossly normal, moves all extremities, PERRLA  Assessment/Plan:  71 y.o. female presenting for annual physical.  Health Maintenance counseling: 1. Anticipatory guidance: Patient counseled regarding regular dental exams -q6 months, eye exams -yearly, wearing seatbelts.  2. Risk factor reduction:  Advised patient of need for regular exercise and diet rich and fruits and vegetables to reduce risk of heart attack and stroke. Exercise- 3 days a week- does some weights and  cardio. Diet-reasonable- eats pretty clearn. .  Wt Readings from Last 3 Encounters:  11/13/17 145 lb 3.2 oz (65.9 kg)  05/28/17 141 lb (64 kg)  05/24/17 143 lb 6.4 oz (65 kg)  3. Immunizations/screenings/ancillary studies- get pneumonia records Immunization History  Administered Date(s) Administered  . Influenza-Unspecified 08/10/2014, 09/26/2016, 09/25/2017  . Td 12/10/2008  4. Cervical cancer screening-  follows with Dr. Runell Gess- still doing pap smears. Also still on progesterone and estrogen- monitoring that 5. Breast cancer screening-  breast  exam withy GYN and mammogram 09/26/17 6. Colon cancer screening - 05/10/17 with 5 year repeat due to polyps- presumed adenoma 7. Skin cancer screening- saw dermatology yesterday-bad prior sun habits. advised regular sunscreen use. Denies worrisome, changing, or new skin lesions.  8. Osteoporosis screening at 65- Sign release of information at the check out desk for last bone density from Dr. Runell Gess.  on fosamax in past- stopped 10 years, calcium, vitamin D  Status of chronic or acute concerns   GERD (gastroesophageal reflux disease) GERD- on protonix 40mg - was thought that chest pain episode was vasovagal response to reflux- has not had recurrence- takes 2-3x a week. Discussed potentially using zantac but with severity of prior episode she prefers to remain on sparing protonix  Essential hypertension HTN- on spironolactone 25mg , valsartan-hctz 80-12.5mg  (not recalled per pharmacy) -discussed potential  beta blocker with aneurysm. In past has tried Beta blocker- decreased exercise capacity and does not want to retrial.   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  Had 6 month repeat Ct angiogram per patient (do not have records yet) and states was stable at 4.3 cm SBP goal 105-120, discussed lower threshold for imaging in other areas such as neuroimaging if headaches  given aneurysm in thoracic aorta without smoking history  We will repeat ct angiogram in 1 year- she may get this in [place].   Hyperlipidemia With aneurysm- opting to be more aggressive about lipid control. On crestor 5mg . LDL still slightly above goal but excellent HDL and total # Lab Results  Component Value Date   CHOL 200 11/12/2017   HDL 86 (A) 11/12/2017   LDLCALC 105 11/12/2017   TRIG 47 11/12/2017      Future Appointments  Date Time Provider [place]  11/13/2018  9:30 AM Marin Olp, MD [facility]  she will likely want to do labs in [place]- may request orders  Orders Placed This Encounter  Procedures  . CBC and differential    This external order was created through the Results Console.  Marland Kitchen VITAMIN D 25 Hydroxy (Vit-D Deficiency, Fractures)    This external order was created through the Results Console.  . Basic metabolic panel    This external order was created through the Results Console.  . Lipid panel    This external order was created through the Results Console.  . Hepatic function panel    This external order was created through the Results Console.    No orders of the defined types were placed in this encounter.   Return precautions advised.  Garret Reddish, MD

## 2017-11-13 NOTE — Patient Instructions (Addendum)
Send me the dates of  1. prevnar 13 2. Pneumovax 23 and we will update our records  Sign release of information at the check out desk for last bone density from Dr. Runell Gess  Blood pressure goal 105-110 ideally with the aneurysm- will look out for the updated CT- thrilled you did this around 6 months out- now we can move to yearly since it is stable.

## 2017-11-13 NOTE — Assessment & Plan Note (Signed)
HTN- on spironolactone 25mg , valsartan-hctz 80-12.5mg  (not recalled per pharmacy) -discussed potential  beta blocker with aneurysm. In past has tried Beta blocker- decreased exercise capacity and does not want to retrial.   Controlled with goal

## 2017-11-13 NOTE — Assessment & Plan Note (Signed)
05/2017- imaging 4.3 cm ascending aneurysm of thoracic aorta noted.  Had 6 month repeat Ct angiogram per patient (do not have records yet) and states was stable at 4.3 cm SBP goal 105-120, discussed lower threshold for imaging in other areas such as neuroimaging if headaches given aneurysm in thoracic aorta without smoking history  We will repeat ct angiogram in 1 year- she may get this in [place].

## 2017-11-13 NOTE — Assessment & Plan Note (Signed)
With aneurysm- opting to be more aggressive about lipid control. On crestor 5mg . LDL still slightly above goal but excellent HDL and total # Lab Results  Component Value Date   CHOL 200 11/12/2017   HDL 86 (A) 11/12/2017   LDLCALC 105 11/12/2017   TRIG 47 11/12/2017

## 2017-11-16 ENCOUNTER — Encounter: Payer: Self-pay | Admitting: Family Medicine

## 2017-11-27 ENCOUNTER — Other Ambulatory Visit: Payer: Self-pay | Admitting: Family Medicine

## 2017-11-27 DIAGNOSIS — H35363 Drusen (degenerative) of macula, bilateral: Secondary | ICD-10-CM | POA: Diagnosis not present

## 2017-11-27 DIAGNOSIS — H3500 Unspecified background retinopathy: Secondary | ICD-10-CM | POA: Diagnosis not present

## 2017-11-27 DIAGNOSIS — H524 Presbyopia: Secondary | ICD-10-CM | POA: Diagnosis not present

## 2017-11-27 DIAGNOSIS — H5203 Hypermetropia, bilateral: Secondary | ICD-10-CM | POA: Diagnosis not present

## 2017-12-06 ENCOUNTER — Encounter: Payer: Self-pay | Admitting: Family Medicine

## 2017-12-23 ENCOUNTER — Other Ambulatory Visit: Payer: Self-pay | Admitting: Family Medicine

## 2017-12-23 DIAGNOSIS — I10 Essential (primary) hypertension: Secondary | ICD-10-CM

## 2017-12-31 ENCOUNTER — Telehealth: Payer: Self-pay | Admitting: Family Medicine

## 2017-12-31 NOTE — Telephone Encounter (Signed)
Please look into the coding of the visit for 11/13/17 and update Lea or myself on if this code was correct or if there was an error and this needs to be resubmitted to insurance.  Copied from [place] [id number]. Topic: Inquiry >> Dec 30, 2017  3:54 PM Ruth Shields wrote: Pt wants to know if this visit was coded incorrectly because Medicare will not pay for this. Visit 11/13/17  . She has spoken  to [facility] billing and they could not help her. Pt would like to see the ABN because she says she was not informed  that medicare would not pay for this visit.

## 2017-12-31 NOTE — Telephone Encounter (Signed)
Medicare does not pay for physicals. I discussed this with patient though did not sign ABN. She stated she had tricare and they would pay for physical- has this been submitted to tricare?  I would be ok going back to visit and reviewing and changing to [phone] or [phone] if needed. I know this was at least a 30 minute visit as a physical and significant portion spent on counseling.

## 2017-12-31 NOTE — Telephone Encounter (Signed)
I have sent an e-mail to charge corrections with Lea attached to resubmit the claim for the date of service to the [employer] insurance if it was not done already. Awaiting to hear back from Charge Corrections to see what the next steps need to be.

## 2017-12-31 NOTE — Telephone Encounter (Signed)
Perfect- can you update patient as well?

## 2018-01-01 NOTE — Telephone Encounter (Signed)
Patient has been notified via husband. Awaiting e-mail from charge corrections.

## 2018-02-11 DIAGNOSIS — Z23 Encounter for immunization: Secondary | ICD-10-CM | POA: Diagnosis not present

## 2018-02-13 DIAGNOSIS — Z13820 Encounter for screening for osteoporosis: Secondary | ICD-10-CM | POA: Diagnosis not present

## 2018-02-13 LAB — HM DEXA SCAN

## 2018-02-18 DIAGNOSIS — Z808 Family history of malignant neoplasm of other organs or systems: Secondary | ICD-10-CM | POA: Diagnosis not present

## 2018-02-18 DIAGNOSIS — D225 Melanocytic nevi of trunk: Secondary | ICD-10-CM | POA: Diagnosis not present

## 2018-02-18 DIAGNOSIS — Z85820 Personal history of malignant melanoma of skin: Secondary | ICD-10-CM | POA: Diagnosis not present

## 2018-02-18 DIAGNOSIS — D485 Neoplasm of uncertain behavior of skin: Secondary | ICD-10-CM | POA: Diagnosis not present

## 2018-02-18 DIAGNOSIS — Z85828 Personal history of other malignant neoplasm of skin: Secondary | ICD-10-CM | POA: Diagnosis not present

## 2018-02-18 DIAGNOSIS — L821 Other seborrheic keratosis: Secondary | ICD-10-CM | POA: Diagnosis not present

## 2018-02-18 DIAGNOSIS — D2261 Melanocytic nevi of right upper limb, including shoulder: Secondary | ICD-10-CM | POA: Diagnosis not present

## 2018-02-18 DIAGNOSIS — Z86018 Personal history of other benign neoplasm: Secondary | ICD-10-CM | POA: Diagnosis not present

## 2018-02-18 DIAGNOSIS — D2272 Melanocytic nevi of left lower limb, including hip: Secondary | ICD-10-CM | POA: Diagnosis not present

## 2018-03-05 ENCOUNTER — Ambulatory Visit (INDEPENDENT_AMBULATORY_CARE_PROVIDER_SITE_OTHER): Payer: Self-pay | Admitting: *Deleted

## 2018-03-05 DIAGNOSIS — I781 Nevus, non-neoplastic: Secondary | ICD-10-CM

## 2018-03-05 NOTE — Progress Notes (Signed)
X=.3% Sotradecol administered with a 27g butterfly.  Patient received a total of 6cc.  Last tx in 2016. Cleaned up a few new spiders and retics. Easy access. Tol well. Will follow prn.  Photos: Yes.    Compression stockings applied: Yes.

## 2018-03-06 ENCOUNTER — Encounter: Payer: Self-pay | Admitting: *Deleted

## 2018-03-06 ENCOUNTER — Encounter: Payer: Self-pay | Admitting: Family Medicine

## 2018-03-19 ENCOUNTER — Encounter: Payer: Self-pay | Admitting: Family Medicine

## 2018-03-19 ENCOUNTER — Telehealth: Payer: Self-pay | Admitting: Family Medicine

## 2018-03-19 NOTE — Telephone Encounter (Signed)
Spoke to pt. She would like for Dr. Yong Channel to send her a mychart message with what type of follow up she needs to do after receiving her bone density results.  Does see need to follow up with you or her GYN?

## 2018-05-28 ENCOUNTER — Telehealth: Payer: Self-pay | Admitting: Vascular Surgery

## 2018-05-28 NOTE — Telephone Encounter (Signed)
Sch appt spk to pt 05/28/18 07/01/18 145pm [place]

## 2018-06-11 DIAGNOSIS — I10 Essential (primary) hypertension: Secondary | ICD-10-CM | POA: Diagnosis not present

## 2018-06-11 DIAGNOSIS — E785 Hyperlipidemia, unspecified: Secondary | ICD-10-CM | POA: Diagnosis not present

## 2018-06-11 DIAGNOSIS — I721 Aneurysm of artery of upper extremity: Secondary | ICD-10-CM | POA: Diagnosis not present

## 2018-06-11 DIAGNOSIS — I714 Abdominal aortic aneurysm, without rupture: Secondary | ICD-10-CM | POA: Diagnosis not present

## 2018-06-17 ENCOUNTER — Other Ambulatory Visit: Payer: Self-pay | Admitting: Cardiovascular Disease

## 2018-06-17 DIAGNOSIS — K219 Gastro-esophageal reflux disease without esophagitis: Secondary | ICD-10-CM

## 2018-06-17 DIAGNOSIS — E785 Hyperlipidemia, unspecified: Secondary | ICD-10-CM

## 2018-07-01 ENCOUNTER — Encounter: Payer: Medicare Other | Admitting: Vascular Surgery

## 2018-07-02 ENCOUNTER — Encounter: Payer: Medicare Other | Admitting: Vascular Surgery

## 2018-07-07 ENCOUNTER — Encounter: Payer: Medicare Other | Admitting: Vascular Surgery

## 2018-08-12 DIAGNOSIS — I82612 Acute embolism and thrombosis of superficial veins of left upper extremity: Secondary | ICD-10-CM | POA: Diagnosis not present

## 2018-08-12 DIAGNOSIS — I878 Other specified disorders of veins: Secondary | ICD-10-CM | POA: Diagnosis not present

## 2018-08-12 DIAGNOSIS — L988 Other specified disorders of the skin and subcutaneous tissue: Secondary | ICD-10-CM | POA: Diagnosis not present

## 2018-08-25 ENCOUNTER — Other Ambulatory Visit: Payer: Self-pay | Admitting: Obstetrics and Gynecology

## 2018-08-25 DIAGNOSIS — H43813 Vitreous degeneration, bilateral: Secondary | ICD-10-CM | POA: Diagnosis not present

## 2018-08-25 DIAGNOSIS — H35363 Drusen (degenerative) of macula, bilateral: Secondary | ICD-10-CM | POA: Diagnosis not present

## 2018-08-25 DIAGNOSIS — H2513 Age-related nuclear cataract, bilateral: Secondary | ICD-10-CM | POA: Diagnosis not present

## 2018-08-25 DIAGNOSIS — Z1231 Encounter for screening mammogram for malignant neoplasm of breast: Secondary | ICD-10-CM

## 2018-08-25 LAB — LIPID PANEL
Cholesterol: 221 — AB (ref 0–200)
HDL: 91 — AB (ref 35–70)
LDL Cholesterol: 117
Triglycerides: 66 (ref 40–160)

## 2018-08-25 LAB — VITAMIN D 25 HYDROXY (VIT D DEFICIENCY, FRACTURES): Vit D, 25-Hydroxy: 68

## 2018-08-25 LAB — HEPATIC FUNCTION PANEL
ALT: 21 (ref 7–35)
AST: 26 (ref 13–35)
Alkaline Phosphatase: 38 (ref 25–125)

## 2018-08-25 LAB — BASIC METABOLIC PANEL
Creatinine: 0.7 (ref 0.5–1.1)
GLUCOSE: 75
POTASSIUM: 4.1 (ref 3.4–5.3)
Sodium: 140 (ref 137–147)

## 2018-08-25 LAB — CBC AND DIFFERENTIAL
HCT: 40 (ref 36–46)
Hemoglobin: 13 (ref 12.0–16.0)
Platelets: 220 (ref 150–399)
WBC: 4.9

## 2018-08-25 LAB — TSH: TSH: 1.29 (ref 0.41–5.90)

## 2018-08-27 DIAGNOSIS — R350 Frequency of micturition: Secondary | ICD-10-CM | POA: Diagnosis not present

## 2018-08-27 DIAGNOSIS — E785 Hyperlipidemia, unspecified: Secondary | ICD-10-CM | POA: Diagnosis not present

## 2018-08-27 DIAGNOSIS — E559 Vitamin D deficiency, unspecified: Secondary | ICD-10-CM | POA: Diagnosis not present

## 2018-08-27 DIAGNOSIS — R5382 Chronic fatigue, unspecified: Secondary | ICD-10-CM | POA: Diagnosis not present

## 2018-08-29 ENCOUNTER — Encounter: Payer: Self-pay | Admitting: Family Medicine

## 2018-09-15 ENCOUNTER — Other Ambulatory Visit: Payer: Self-pay | Admitting: Cardiovascular Disease

## 2018-09-15 DIAGNOSIS — K219 Gastro-esophageal reflux disease without esophagitis: Secondary | ICD-10-CM

## 2018-09-15 DIAGNOSIS — E785 Hyperlipidemia, unspecified: Secondary | ICD-10-CM

## 2018-09-17 ENCOUNTER — Encounter: Payer: Self-pay | Admitting: Cardiovascular Disease

## 2018-09-17 ENCOUNTER — Ambulatory Visit (INDEPENDENT_AMBULATORY_CARE_PROVIDER_SITE_OTHER): Payer: Medicare Other | Admitting: Cardiovascular Disease

## 2018-09-17 VITALS — BP 110/70 | HR 71 | Ht 65.0 in | Wt 146.0 lb

## 2018-09-17 DIAGNOSIS — I712 Thoracic aortic aneurysm, without rupture: Secondary | ICD-10-CM

## 2018-09-17 DIAGNOSIS — I7121 Aneurysm of the ascending aorta, without rupture: Secondary | ICD-10-CM

## 2018-09-17 DIAGNOSIS — I10 Essential (primary) hypertension: Secondary | ICD-10-CM | POA: Diagnosis not present

## 2018-09-17 DIAGNOSIS — E782 Mixed hyperlipidemia: Secondary | ICD-10-CM

## 2018-09-17 MED ORDER — NITROGLYCERIN 0.4 MG SL SUBL: 0.4000 mg | SUBLINGUAL_TABLET | SUBLINGUAL | 3 refills | Status: DC | PRN

## 2018-09-17 NOTE — Patient Instructions (Signed)
Medication Instructions:  1) You have been given a prescription for NITROGLYCERIN to take as needed. If you experience chest pain, you may take one tablet. If you pain is not resolved in 5 minutes, you may take another tablet. You can take UP TO THREE tablets. If your chest pain does not resolve within 5 minutes of taking the last tablet, call 911.  Labwork: None  Testing/Procedures: Dr. Burt Knack recommends you have a CHEST MRA.  Follow-Up: Your provider wants you to follow-up in: 1 year with Dr. Burt Knack. You will receive a reminder letter in the mail two months in advance. If you don't receive a letter, please call our office to schedule the follow-up appointment.    Any Other Special Instructions Will Be Listed Below (If Applicable).     If you need a refill on your cardiac medications before your next appointment, please call your pharmacy.

## 2018-09-17 NOTE — Progress Notes (Signed)
Cardiology Office Note:    Date:  09/17/2018   ID:  Ruth Shields, DOB [birth year], MRN [id number]  PCP:  Marin Olp, MD  Cardiologist:  Sherren Mocha, MD  Electrophysiologist:  None   Referring MD: Marin Olp, MD   Chief Complaint  Patient presents with  . Follow-up  . Hypertension    History of Present Illness:    Ruth Shields is a 72 y.o. 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  Patient had an episode of severe chest pain last year.  She had an abnormal stress echocardiogram and ultimately underwent diagnostic cardiac catheterization demonstrating only mild nonobstructive CAD involving the LAD.  Medical therapy was recommended.  She is had some difficulty tolerating aspirin because of easy bruising and she has discontinued this.  She is taking a low-dose of rosuvastatin on an intermittent basis.  More recently after seeing the results of her lipid panel, she has increased rosuvastatin to every other day dosing.  She is had no recurrence of chest pain or chest pressure.  She denies shortness of breath or other complaints.  She is physically active with regular exercise and no exertional symptoms.  Past Medical History:  Diagnosis Date  . Cancer ([place])   . Hypertension   . Premature ventricular contractions (PVCs) ([facility])     Past Surgical History:  Procedure Laterality Date  . BREAST BIOPSY     x2- benign  . BREAST EXCISIONAL BIOPSY Right    20 years ago x 2  . COLONOSCOPY    . COLONOSCOPY W/ BIOPSIES AND POLYPECTOMY  2004   Dr. Cristal Ford, [place]   . HERNIA REPAIR     left  . KNEE ARTHROSCOPY     left and right- torn meniscus- [place] [facility]  . LEFT HEART CATH AND CORONARY ANGIOGRAPHY N/A 05/28/2017   Procedure: Left Heart Cath and Coronary Angiography;  Surgeon: Sherren Mocha, MD;  Location: [facility];  Service: Cardiovascular;  Laterality: N/A;  .  POLYPECTOMY    . SKIN CANCER EXCISION     Dr. Delman Cheadle    Current Medications: Current Meds  Medication Sig  . calcium carbonate (OS-CAL) 1250 (500 Ca) MG chewable tablet Chew 1 tablet by mouth daily.  . diclofenac (VOLTAREN) 75 MG EC tablet Take 75 mg by mouth 2 (two) times daily as needed (headaches).   Marland Kitchen estradiol (VIVELLE-DOT) 0.0375 MG/24HR Place 1 patch onto the skin 2 (two) times a week.  . Multiple Vitamins-Minerals (MULTIVITAMIN WITH MINERALS) tablet Take 1 tablet by mouth daily.  . pantoprazole (PROTONIX) 40 MG tablet TAKE 1 TABLET DAILY (MUST KEEP UPCOMING APPOINTMENT FOR FUTURE REFILLS)  . progesterone (PROMETRIUM) 100 MG capsule Take 100 mg by mouth every evening.   . rosuvastatin (CRESTOR) 5 MG tablet Take 5 mg by mouth daily.  Marland Kitchen senna (SENOKOT) 8.6 MG tablet Take 1 tablet by mouth daily.  Marland Kitchen spironolactone (ALDACTONE) 25 MG tablet Take 25 mg by mouth daily.  . valsartan-hydrochlorothiazide (DIOVAN-HCT) 80-12.5 MG tablet Take 1 tablet by mouth daily.  . Vitamin D, Ergocalciferol, (DRISDOL) 50000 UNITS CAPS capsule Take 50,000 Units by mouth every 7 (seven) days.   Current Facility-Administered Medications for the 09/17/18 encounter (Office Visit) with Sherren Mocha, MD  Medication  . 0.9 %  sodium chloride infusion     Allergies:   Patient has no known allergies.   Social History   Socioeconomic History  . Marital status: Married  Spouse name: Not on file  . Number of children: Not on file  . Years of education: Not on file  . Highest education level: Not on file  Occupational History  . Not on file  Social Needs  . Financial resource strain: Not on file  . Food insecurity:    Worry: Not on file    Inability: Not on file  . Transportation needs:    Medical: Not on file    Non-medical: Not on file  Tobacco Use  . Smoking status: Never Smoker  . Smokeless tobacco: Never Used  Substance and Sexual Activity  . Alcohol use: Yes    Alcohol/week: 7.0 standard  drinks    Types: 7 Glasses of wine per week  . Drug use: No  . Sexual activity: Not on file  Lifestyle  . Physical activity:    Days per week: Not on file    Minutes per session: Not on file  . Stress: Not on file  Relationships  . Social connections:    Talks on phone: Not on file    Gets together: Not on file    Attends religious service: Not on file    Active member of club or organization: Not on file    Attends meetings of clubs or organizations: Not on file    Relationship status: Not on file  Other Topics Concern  . Not on file  Social History Narrative   Married. 2 children (4 son with 46 sons, 53 year old married- horse and dogs) in 2017.    Lives in [place].       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 There is no history of Colon cancer, Stomach cancer, Rectal cancer, or Esophageal cancer.  ROS:   Please see the history of present illness.     All other systems reviewed and are negative.  EKGs/Labs/Other Studies Reviewed:    The following studies were reviewed today: Cardiac Cath 05-28-2018: Conclusion   1. Mild nonobstructive CAD, primarily affecting the LAD 2. Angiographically normal RCA, left main, and LCx 3. Normal LVEDP 4. Normal LV systolic function by echo assessment  Suspect noncardiac chest pain. Low-dose statin reasonable consideration for risk reduction if tolerated.      EKG:  EKG is ordered today.  The ekg ordered today demonstrates normal sinus rhythm 71 bpm, low voltage QRS.  Recent Labs: 11/12/2017: BUN  21 08/25/2018: ALT 21; Creatinine 0.7; Hemoglobin 13.0; Platelets 220; Potassium 4.1; Sodium 140; TSH 1.29  Recent Lipid Panel    Component Value Date/Time   CHOL 221 (A) 08/25/2018   TRIG 66 08/25/2018   HDL 91 (A) 08/25/2018   LDLCALC 117 08/25/2018    Physical Exam:    VS:  BP 110/70   Pulse 71   Ht 5\' 5"  (1.651 m)   Wt 146 lb (66.2 kg)   BMI 24.30 kg/m     Wt Readings from Last 3 Encounters:  09/17/18 146 lb (66.2 kg)  11/13/17 145 lb 3.2 oz (65.9 kg)  05/28/17 141 lb (  64 kg)     GEN:  Well nourished, well developed in no acute distress HEENT: Normal NECK: No JVD; No carotid bruits LYMPHATICS: No lymphadenopathy CARDIAC: RRR, no murmurs, rubs, gallops RESPIRATORY:  Clear to auscultation without rales, wheezing or rhonchi  ABDOMEN: Soft, non-tender, non-distended MUSCULOSKELETAL:  No edema; No deformity  SKIN: Warm and dry NEUROLOGIC:  Alert and oriented x 3 PSYCHIATRIC:  Normal affect   ASSESSMENT:    1. Aneurysm of ascending aorta (HCC)   2. Essential hypertension   3. Mixed hyperlipidemia    PLAN:    In order of problems listed above:  1. The patient had an outside CT scan of the chest in November 2018 demonstrating stable dilatation of the ascending aorta, measured at 4.3 cm.  I have recommended a surveillance MRA of the chest and this will be scheduled next month for one-year follow-up. 2. Blood pressure is well controlled on spironolactone, valsartan, and hydrochlorothiazide. 3. The patient is treated with Crestor every other day.  I reviewed her most recent lipid panel.  She is going to try to be more consistent with dosing.  She follows an excellent diet and exercise regimen.   Medication Adjustments/Labs and Tests Ordered: Current medicines are reviewed at length with the patient today.  Concerns regarding medicines are outlined above.  Orders Placed This Encounter  Procedures  . MR MRA CHEST W WO CONTRAST  . EKG 12-Lead   Meds ordered this  encounter  Medications  . DISCONTD: nitroGLYCERIN (NITROSTAT) 0.4 MG SL tablet    Sig: Place 1 tablet (0.4 mg total) under the tongue every 5 (five) minutes as needed for chest pain.    Dispense:  25 tablet    Refill:  3  . nitroGLYCERIN (NITROSTAT) 0.4 MG SL tablet    Sig: Place 1 tablet (0.4 mg total) under the tongue every 5 (five) minutes as needed for chest pain.    Dispense:  25 tablet    Refill:  3    Patient Instructions  Medication Instructions:  1) You have been given a prescription for NITROGLYCERIN to take as needed. If you experience chest pain, you may take one tablet. If you pain is not resolved in 5 minutes, you may take another tablet. You can take UP TO THREE tablets. If your chest pain does not resolve within 5 minutes of taking the last tablet, call 911.  Labwork: None  Testing/Procedures: Dr. Burt Knack recommends you have a CHEST MRA.  Follow-Up: Your provider wants you to follow-up in: 1 year with Dr. Burt Knack. You will receive a reminder letter in the mail two months in advance. If you don't receive a letter, please call our office to schedule the follow-up appointment.    Any Other Special Instructions Will Be Listed Below (If Applicable).     If you need a refill on your cardiac medications before your next appointment, please call your pharmacy.      Signed, Sherren Mocha, MD  09/17/2018 5:41 PM    [place]

## 2018-09-19 DIAGNOSIS — Z23 Encounter for immunization: Secondary | ICD-10-CM | POA: Diagnosis not present

## 2018-09-29 DIAGNOSIS — Z6825 Body mass index (BMI) 25.0-25.9, adult: Secondary | ICD-10-CM | POA: Diagnosis not present

## 2018-09-29 DIAGNOSIS — Z124 Encounter for screening for malignant neoplasm of cervix: Secondary | ICD-10-CM | POA: Diagnosis not present

## 2018-09-30 ENCOUNTER — Ambulatory Visit
Admission: RE | Admit: 2018-09-30 | Discharge: 2018-09-30 | Disposition: A | Discharge status: Home / Self Care | Payer: Medicare Other | Source: Ambulatory Visit | Attending: Obstetrics and Gynecology | Admitting: Obstetrics and Gynecology

## 2018-09-30 DIAGNOSIS — Z1231 Encounter for screening mammogram for malignant neoplasm of breast: Secondary | ICD-10-CM

## 2018-10-15 ENCOUNTER — Ambulatory Visit (HOSPITAL_COMMUNITY)
Admission: RE | Admit: 2018-10-15 | Discharge: 2018-10-15 | Disposition: A | Discharge status: Home / Self Care | Payer: Medicare Other | Source: Ambulatory Visit | Attending: Cardiovascular Disease | Admitting: Cardiovascular Disease

## 2018-10-15 DIAGNOSIS — I712 Thoracic aortic aneurysm, without rupture: Secondary | ICD-10-CM | POA: Diagnosis not present

## 2018-10-15 DIAGNOSIS — I7121 Aneurysm of the ascending aorta, without rupture: Secondary | ICD-10-CM

## 2018-10-15 MED ORDER — GADOBUTROL 1 MMOL/ML IV SOLN
6.0000 mL | Freq: Once | INTRAVENOUS | Status: AC | PRN
  Administered 2018-10-15: 6 mL via INTRAVENOUS

## 2018-10-16 LAB — POCT I-STAT CREATININE: Creatinine, Ser: 0.8 mg/dL (ref 0.44–1.00)

## 2018-10-30 ENCOUNTER — Other Ambulatory Visit: Payer: Self-pay

## 2018-11-03 ENCOUNTER — Telehealth: Payer: Self-pay

## 2018-11-03 DIAGNOSIS — I7121 Aneurysm of the ascending aorta, without rupture: Secondary | ICD-10-CM

## 2018-11-03 DIAGNOSIS — I712 Thoracic aortic aneurysm, without rupture: Secondary | ICD-10-CM

## 2018-11-03 NOTE — Telephone Encounter (Signed)
-----   Message from Sherren Mocha, MD sent at 10/17/2018  3:18 PM EST ----- Findings reviewed with patient.  Maximum diameter of her thoracic aorta is 4.4 cm.  This is stable from last year study.  Recommend repeat MRA in 1 year and follow-up office visit after the imaging study.

## 2018-11-03 NOTE — Telephone Encounter (Signed)
Repeat MRA ordered to be scheduled in 1 year. Reminder set to arrange 1 year visit with Dr. Burt Knack with MRA prior.

## 2018-11-13 ENCOUNTER — Ambulatory Visit: Payer: Medicare Other | Admitting: Family Medicine

## 2018-12-19 ENCOUNTER — Other Ambulatory Visit: Payer: Self-pay | Admitting: Family Medicine

## 2018-12-23 ENCOUNTER — Other Ambulatory Visit: Payer: Self-pay

## 2018-12-23 MED ORDER — VALSARTAN-HYDROCHLOROTHIAZIDE 80-12.5 MG PO TABS: 1.0000 | ORAL_TABLET | Freq: Every day | ORAL | 3 refills | Status: DC

## 2018-12-23 MED ORDER — SPIRONOLACTONE 25 MG PO TABS: 25.0000 mg | ORAL_TABLET | Freq: Every day | ORAL | 3 refills | Status: DC

## 2019-01-17 ENCOUNTER — Other Ambulatory Visit: Payer: Self-pay | Admitting: Cardiovascular Disease

## 2019-03-19 ENCOUNTER — Encounter: Payer: Self-pay | Admitting: Family Medicine

## 2019-03-19 ENCOUNTER — Ambulatory Visit: Payer: Medicare Other | Admitting: Family Medicine

## 2019-03-19 ENCOUNTER — Ambulatory Visit (INDEPENDENT_AMBULATORY_CARE_PROVIDER_SITE_OTHER): Payer: Medicare Other | Admitting: Family Medicine

## 2019-03-19 VITALS — BP 111/73 | HR 70 | Temp 97.6°F | Ht 65.0 in | Wt 144.0 lb

## 2019-03-19 DIAGNOSIS — I10 Essential (primary) hypertension: Secondary | ICD-10-CM | POA: Diagnosis not present

## 2019-03-19 DIAGNOSIS — I2510 Atherosclerotic heart disease of native coronary artery without angina pectoris: Secondary | ICD-10-CM | POA: Insufficient documentation

## 2019-03-19 DIAGNOSIS — K219 Gastro-esophageal reflux disease without esophagitis: Secondary | ICD-10-CM

## 2019-03-19 DIAGNOSIS — E785 Hyperlipidemia, unspecified: Secondary | ICD-10-CM

## 2019-03-19 DIAGNOSIS — I712 Thoracic aortic aneurysm, without rupture: Secondary | ICD-10-CM | POA: Diagnosis not present

## 2019-03-19 DIAGNOSIS — I7121 Aneurysm of the ascending aorta, without rupture: Secondary | ICD-10-CM

## 2019-03-19 MED ORDER — PANTOPRAZOLE SODIUM 40 MG PO TBEC: DELAYED_RELEASE_TABLET | ORAL | 1 refills | Status: DC

## 2019-03-19 NOTE — Assessment & Plan Note (Signed)
S: controlled on spironolactone 25 mg, valsartan hctz 80-12.5mg  BP Readings from Last 3 Encounters:  03/19/19 111/73  09/17/18 110/70  11/13/17 110/76  A/P: Stable. Continue current medications.

## 2019-03-19 NOTE — Assessment & Plan Note (Signed)
S:doing reasonably well on pepcid for the most part- occasionally needs protonix.  A/P: Stable. Continue current medications. Given rare PPI would not be too concerned about b12 levels

## 2019-03-19 NOTE — Patient Instructions (Addendum)
Health Maintenance Due  Topic Date Due  . PNA vac Low Risk Adult (2 of 2 - PPSV23) Pt will check on this as she believes she has already had it. 04/29/2015   Video visit

## 2019-03-19 NOTE — Progress Notes (Signed)
Phone [phone]   Subjective:  Virtual visit via Video note. Chief complaint: Chief Complaint  Patient presents with  . Follow-up    htn, gerd   This visit type was conducted due to national recommendations for restrictions regarding the COVID-19 Pandemic (e.g. social distancing).  This format is felt to be most appropriate for this patient at this time balancing risks to patient and risks to population by having him in for in person visit.  All issues noted in this document were discussed and addressed.  No physical exam was performed (except for noted visual exam or audio findings with Telehealth visits).  The patient has consented to conduct a Telehealth visit and understands insurance will be billed.   Our team/I connected with Ruth Shields on 03/19/19 at  1:40 PM EDT by a video enabled telemedicine application ([email or web address]) and verified that I am speaking with the correct person using two identifiers.  Location patient: [facility] Location provider: [facility], office Persons participating in the virtual visit:  patient  Our team/I discussed the limitations of evaluation and management by telemedicine and the availability of in person appointments. In light of current covid-19 pandemic, patient also understands that we are trying to protect them by minimizing in office contact if at all possible.  The patient expressed consent for telemedicine visit and agreed to proceed. Patient understands insurance will be billed.   ROS- No chest pain or shortness of breath. No headache or blurry vision.    Past Medical History-  Patient Active Problem List   Diagnosis Date Noted  . Nonobstructive CAD per cath 2018-Dr. Burt Knack 03/19/2019    Priority: High  . Aneurysm of ascending aorta (HCC) 05/23/2017    Priority: High  . Hyperlipidemia 11/13/2017    Priority: Medium  . Osteoporosis 11/12/2016    Priority: Medium  . Hormone replacement therapy 11/12/2016    Priority: Medium  . Essential  hypertension 03/21/2010    Priority: Medium  . GERD (gastroesophageal reflux disease) 11/12/2016    Priority: Low  . Palpitations 07/31/2014    Priority: Low  . MURMUR 03/21/2010    Priority: Low  . Abnormal stress echo 05/28/2017    Medications- reviewed and updated Current Outpatient Medications  Medication Sig Dispense Refill  . calcium carbonate (OS-CAL) 1250 (500 Ca) MG chewable tablet Chew 1 tablet by mouth daily.    . diclofenac (VOLTAREN) 75 MG EC tablet Take 75 mg by mouth 2 (two) times daily as needed (headaches).     Marland Kitchen estradiol (VIVELLE-DOT) 0.0375 MG/24HR Place 1 patch onto the skin 2 (two) times a week.    . Multiple Vitamins-Minerals (MULTIVITAMIN WITH MINERALS) tablet Take 1 tablet by mouth daily.    . nitroGLYCERIN (NITROSTAT) 0.4 MG SL tablet Place 1 tablet (0.4 mg total) under the tongue every 5 (five) minutes as needed for chest pain. 25 tablet 3  . pantoprazole (PROTONIX) 40 MG tablet TAKE 1 TABLET DAILY AS NEEDED FOR REFLUX if pepcid not effective 90 tablet 1  . progesterone (PROMETRIUM) 100 MG capsule Take 100 mg by mouth every evening.     . rosuvastatin (CRESTOR) 5 MG tablet Take 1 tablet (5 mg total) by mouth daily. 90 tablet 2  . senna (SENOKOT) 8.6 MG tablet Take 1 tablet by mouth daily.    Marland Kitchen spironolactone (ALDACTONE) 25 MG tablet Take 1 tablet (25 mg total) by mouth daily. 90 tablet 3  . valsartan-hydrochlorothiazide (DIOVAN-HCT) 80-12.5 MG tablet Take 1 tablet by mouth daily. 90 tablet 3  .  Vitamin D, Ergocalciferol, (DRISDOL) 50000 UNITS CAPS capsule Take 50,000 Units by mouth every 7 (seven) days.     Current Facility-Administered Medications  Medication Dose Route Frequency Provider Last Rate Last Dose  . 0.9 %  sodium chloride infusion  500 mL Intravenous Continuous Ladene Artist, MD         Objective:  BP 111/73 (BP Location: Right Arm, Patient Position: Sitting, Cuff Size: Normal)   Pulse 70   Temp 97.6 F (36.4 C) (Oral)   Ht 5\' 5"  (1.651  m)   Wt 144 lb (65.3 kg)   BMI 23.96 kg/m  Gen: NAD, resting comfortably Lungs: nonlabored, normal respiratory rate  Skin: appears dry, no obvious rash Normal speech and facial movement    Assessment and Plan   #hypertension S: controlled on spironolactone 25 mg, valsartan hctz 80-12.5mg  BP Readings from Last 3 Encounters:  03/19/19 111/73  09/17/18 110/70  11/13/17 110/76  A/P: Stable. Continue current medications.  Due to aneurysm blood pressure goal is 105-1 20  # GERD S:doing reasonably well on pepcid for the most part- occasionally needs protonix.  A/P: Stable. Continue current medications. Given rare PPI would not be too concerned about b12 levels  # CAD S: Patient with known mild nonobstructive CAD primarily affecting the LAD based off catheterization by Dr. Burt Knack in 2018.  Patient had some central chest pain in recent months- nitroglycerin was effective.  A/P: Stable- used nitroglycerin once with relief. Knows to call if has to use 3 in 15 minutes or if she is having increasing use overall  #hyperlipidemia S: poorly controlled on last check but was not consistently taking every other day- she has been more consistent since that time  Lab Results  Component Value Date   CHOL 221 (A) 08/25/2018   HDL 91 (A) 08/25/2018   LDLCALC 117 08/25/2018   TRIG 66 08/25/2018   A/P: I suspect improved control with patient having more consistent with Crestor 5 mg-When it is safer from COVID-19 perspective we will try to have written for updated blood work-she states she may get this done when she sees cardiology in the fall - rx says daily and we may go upt o that if remains poorly controlled next visit  # aortic aneurysm (ascending) S:Incidental during chest pain workup 05/21/17 at [facility]. 4.3 cm.  Stable on imaging November 2019-follows with Dr. Burt Knack now with last visit in October 2019.  Blood pressure at goal. 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 Doing classes online through [employer] and walking at her mom's 3.  She thinks she had Pneumovax at outside facility-she is going to try to get records of that  6-12 months reasonable  Lab/Order associations: Gastroesophageal reflux disease, esophagitis presence not specified - Plan: pantoprazole (PROTONIX) 40 MG tablet  Essential hypertension  Aneurysm of ascending aorta (HCC)  Coronary artery disease involving native coronary artery of native heart without angina pectoris  Meds ordered this encounter  Medications  . pantoprazole (PROTONIX) 40 MG tablet    Sig: TAKE 1 TABLET DAILY AS NEEDED FOR REFLUX if pepcid not effective    Dispense:  90 tablet    Refill:  1   Return precautions advised.  Garret Reddish, MD

## 2019-03-19 NOTE — Assessment & Plan Note (Addendum)
S: Patient with known mild nonobstructive CAD primarily affecting the LAD based off catheterization by Dr. Burt Knack in 2018.  Patient had some central chest pain in recent months- nitroglycerin was effective.  A/P: Stable- used nitroglycerin once with relief. Knows to call if has to use 3 in 15 minutes or if she is having increasing use overall -asa has not been recommended at this point by cardiology

## 2019-03-19 NOTE — Assessment & Plan Note (Signed)
S:Incidental during chest pain workup 05/21/17 at [facility]. 4.3 cm.  Stable on imaging November 2019-follows with Dr. Burt Knack now with last visit in October 2019.  Blood pressure at goal. A/P: Stable-continue cardiology follow-up and tight blood pressure control

## 2019-03-24 DIAGNOSIS — M79644 Pain in right finger(s): Secondary | ICD-10-CM | POA: Diagnosis not present

## 2019-03-24 DIAGNOSIS — S61210A Laceration without foreign body of right index finger without damage to nail, initial encounter: Secondary | ICD-10-CM | POA: Diagnosis not present

## 2019-03-25 ENCOUNTER — Encounter: Payer: Self-pay | Admitting: Family Medicine

## 2019-04-30 DIAGNOSIS — Z20828 Contact with and (suspected) exposure to other viral communicable diseases: Secondary | ICD-10-CM | POA: Diagnosis not present

## 2019-06-09 DIAGNOSIS — D225 Melanocytic nevi of trunk: Secondary | ICD-10-CM | POA: Diagnosis not present

## 2019-06-09 DIAGNOSIS — Z85828 Personal history of other malignant neoplasm of skin: Secondary | ICD-10-CM | POA: Diagnosis not present

## 2019-06-09 DIAGNOSIS — Z86018 Personal history of other benign neoplasm: Secondary | ICD-10-CM | POA: Diagnosis not present

## 2019-06-09 DIAGNOSIS — D2272 Melanocytic nevi of left lower limb, including hip: Secondary | ICD-10-CM | POA: Diagnosis not present

## 2019-06-09 DIAGNOSIS — L821 Other seborrheic keratosis: Secondary | ICD-10-CM | POA: Diagnosis not present

## 2019-06-09 DIAGNOSIS — Z85820 Personal history of malignant melanoma of skin: Secondary | ICD-10-CM | POA: Diagnosis not present

## 2019-06-09 DIAGNOSIS — Z808 Family history of malignant neoplasm of other organs or systems: Secondary | ICD-10-CM | POA: Diagnosis not present

## 2019-06-25 ENCOUNTER — Encounter: Payer: Self-pay | Admitting: Family Medicine

## 2019-06-26 ENCOUNTER — Other Ambulatory Visit: Payer: Self-pay

## 2019-06-26 MED ORDER — VALSARTAN-HYDROCHLOROTHIAZIDE 80-12.5 MG PO TABS: 1.0000 | ORAL_TABLET | Freq: Every day | ORAL | 3 refills | Status: DC

## 2019-08-18 ENCOUNTER — Other Ambulatory Visit: Payer: Self-pay | Admitting: Obstetrics and Gynecology

## 2019-08-18 DIAGNOSIS — Z1231 Encounter for screening mammogram for malignant neoplasm of breast: Secondary | ICD-10-CM

## 2019-08-31 DIAGNOSIS — H2513 Age-related nuclear cataract, bilateral: Secondary | ICD-10-CM | POA: Diagnosis not present

## 2019-08-31 DIAGNOSIS — H35363 Drusen (degenerative) of macula, bilateral: Secondary | ICD-10-CM | POA: Diagnosis not present

## 2019-08-31 DIAGNOSIS — H5203 Hypermetropia, bilateral: Secondary | ICD-10-CM | POA: Diagnosis not present

## 2019-08-31 DIAGNOSIS — H43813 Vitreous degeneration, bilateral: Secondary | ICD-10-CM | POA: Diagnosis not present

## 2019-09-20 IMAGING — MG 2D DIGITAL SCREENING BILATERAL MAMMOGRAM WITH CAD AND ADJUNCT TO
9 of 12 series · 9 of 28 positions shown · non-contrast
Comparison: Previous exam(s).

CLINICAL DATA: Screening.

EXAM:
2D DIGITAL SCREENING BILATERAL MAMMOGRAM WITH CAD AND ADJUNCT TOMO

[R MLO synth-2D]
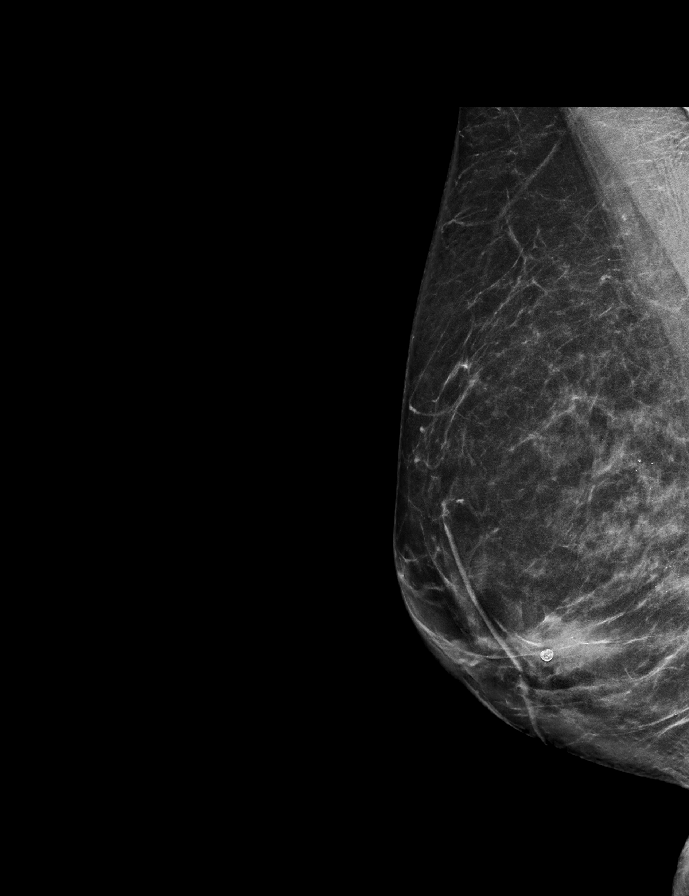

[R MLO]
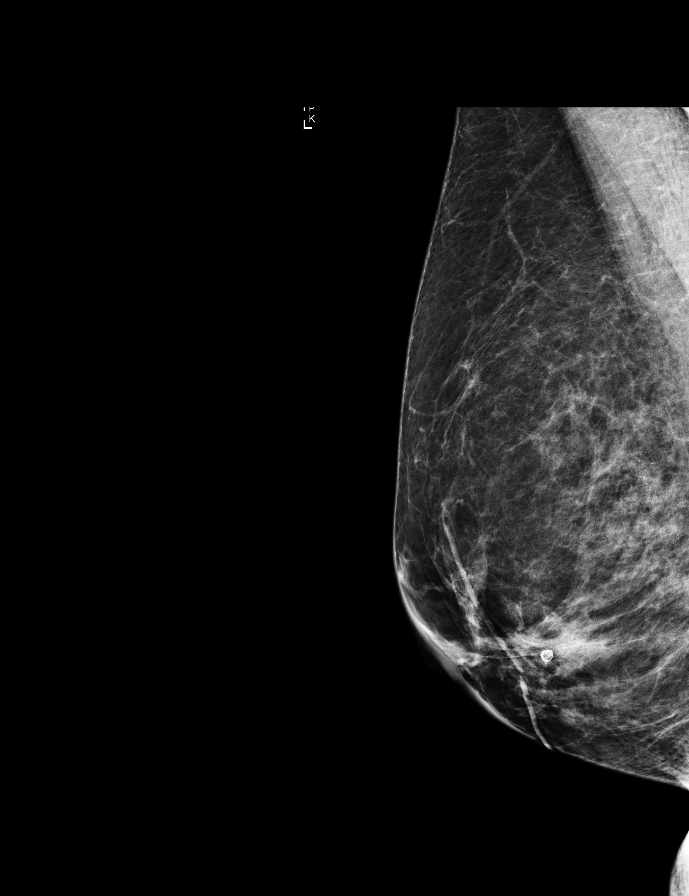

[L MLO]
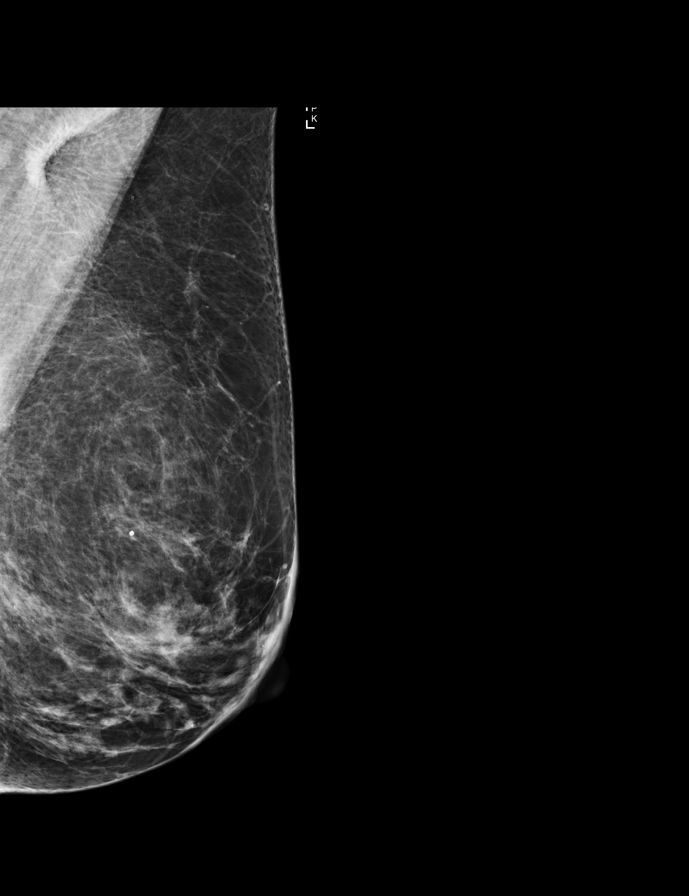

[L CC]
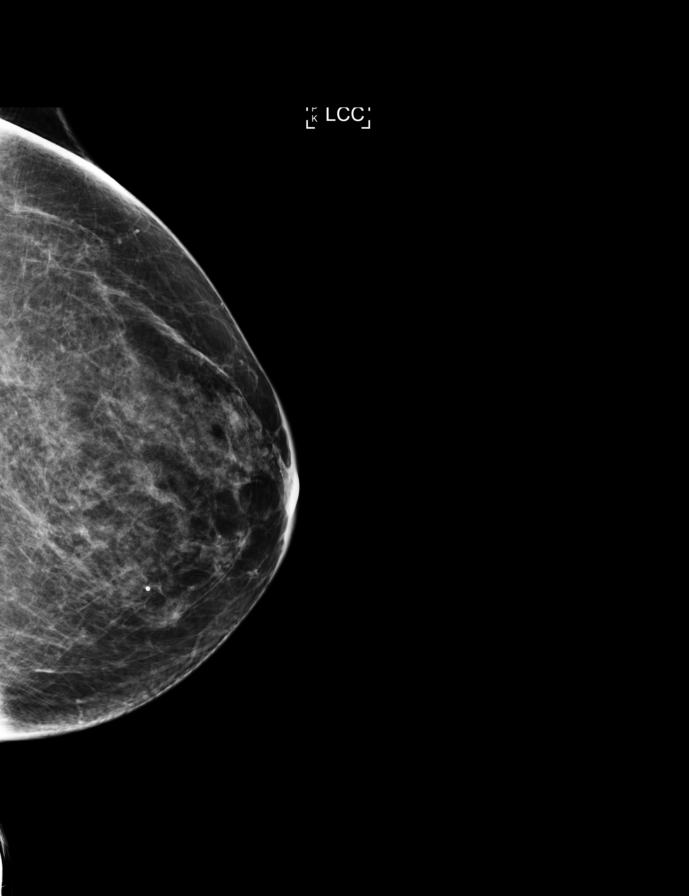

[L CC synth-2D]
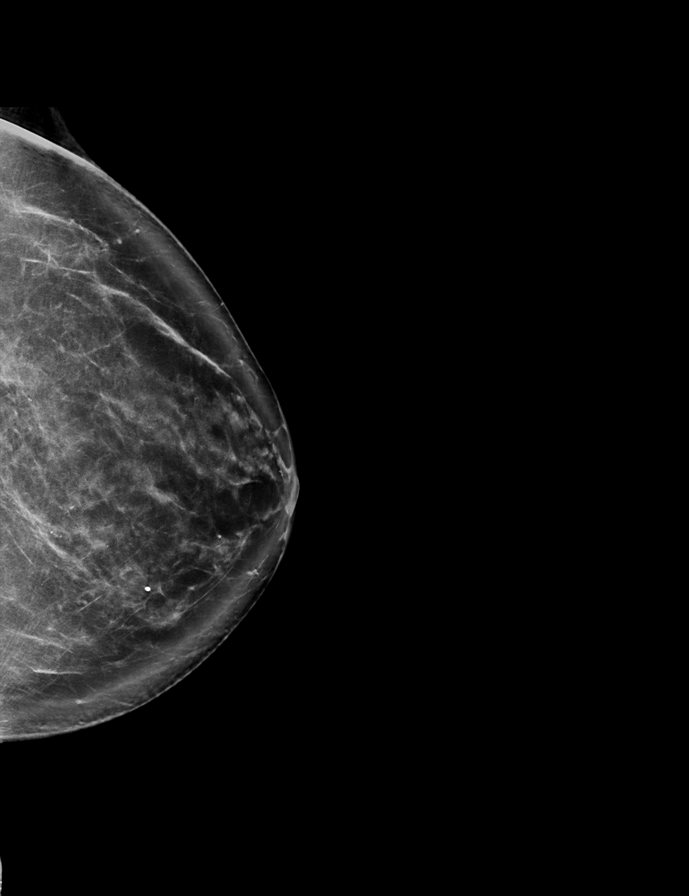

[R CC synth-2D]
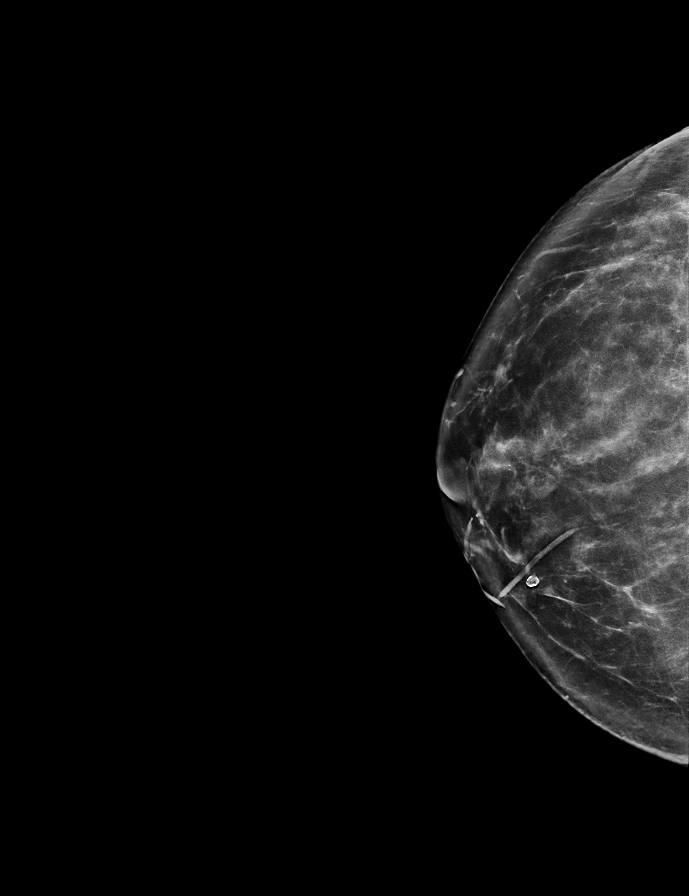

[L MLO synth-2D]
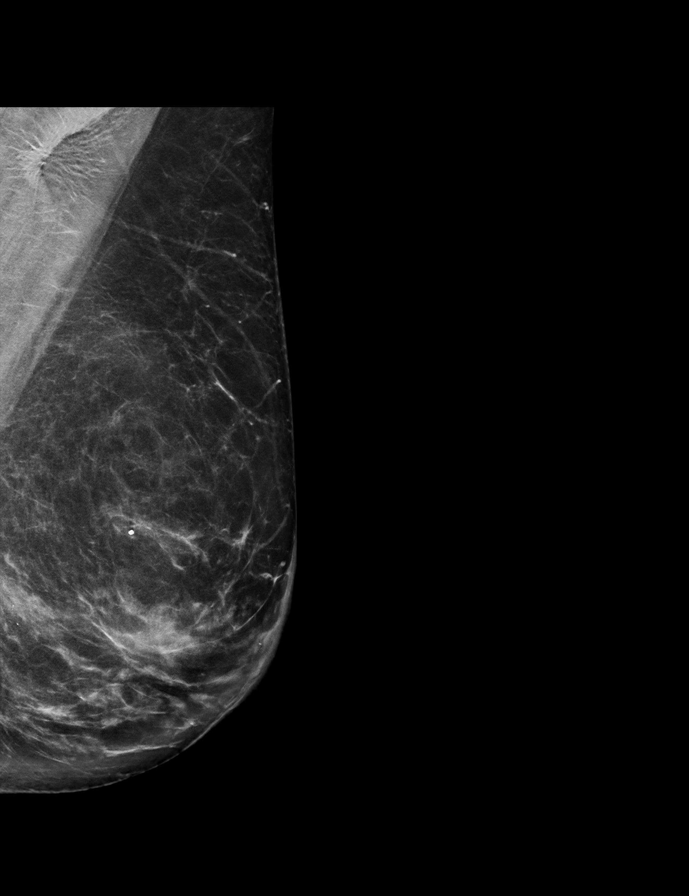

[R CC]
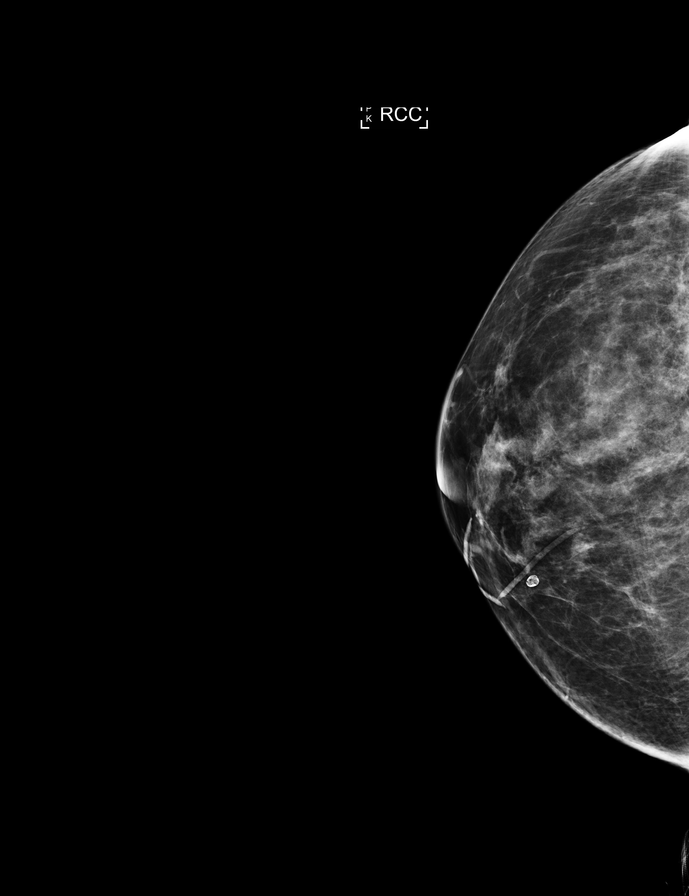

[L MLO tomo · tomo slice 37/74.0]
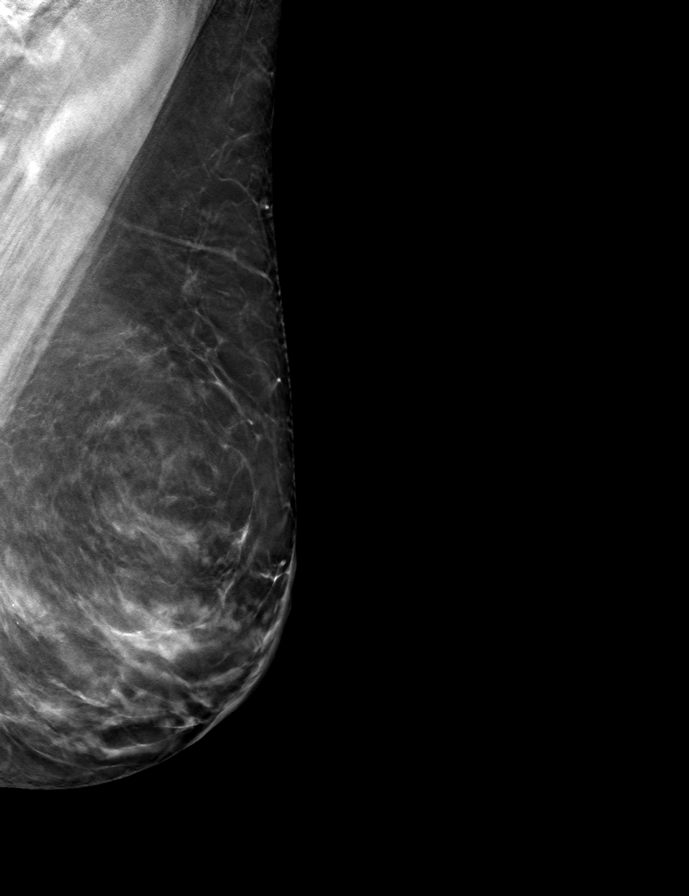

[9 of 28 positions shown; findings below may reference images not displayed]

ACR Breast Density Category c: The breast tissue is heterogeneously
dense, which may obscure small masses.
FINDINGS: There are no findings suspicious for malignancy. Images were
processed with CAD.
IMPRESSION: No mammographic evidence of malignancy. A result letter of this
screening mammogram will be mailed directly to the patient.

RECOMMENDATION:
Screening mammogram in one year. (Code:[id number])

BI-RADS CATEGORY  1: Negative.

## 2019-09-28 DIAGNOSIS — Z23 Encounter for immunization: Secondary | ICD-10-CM | POA: Diagnosis not present

## 2019-10-05 ENCOUNTER — Other Ambulatory Visit: Payer: Self-pay

## 2019-10-05 ENCOUNTER — Ambulatory Visit
Admission: RE | Admit: 2019-10-05 | Discharge: 2019-10-05 | Disposition: A | Discharge status: Home / Self Care | Payer: Medicare Other | Source: Ambulatory Visit | Attending: Obstetrics and Gynecology | Admitting: Obstetrics and Gynecology

## 2019-10-05 DIAGNOSIS — Z1231 Encounter for screening mammogram for malignant neoplasm of breast: Secondary | ICD-10-CM | POA: Diagnosis not present

## 2019-10-05 DIAGNOSIS — Z6825 Body mass index (BMI) 25.0-25.9, adult: Secondary | ICD-10-CM | POA: Diagnosis not present

## 2019-10-05 DIAGNOSIS — Z01419 Encounter for gynecological examination (general) (routine) without abnormal findings: Secondary | ICD-10-CM | POA: Diagnosis not present

## 2019-10-09 DIAGNOSIS — R350 Frequency of micturition: Secondary | ICD-10-CM | POA: Diagnosis not present

## 2019-10-09 DIAGNOSIS — E559 Vitamin D deficiency, unspecified: Secondary | ICD-10-CM | POA: Diagnosis not present

## 2019-10-09 DIAGNOSIS — E785 Hyperlipidemia, unspecified: Secondary | ICD-10-CM | POA: Diagnosis not present

## 2019-10-09 DIAGNOSIS — R5382 Chronic fatigue, unspecified: Secondary | ICD-10-CM | POA: Diagnosis not present

## 2019-10-09 LAB — CBC AND DIFFERENTIAL
HCT: 97 — AB (ref 36–46)
Hemoglobin: 37.2 — AB (ref 12.0–16.0)
WBC: 3.8

## 2019-10-09 LAB — LIPID PANEL
Cholesterol: 174 (ref 0–200)
HDL: 79 — AB (ref 35–70)
LDL Cholesterol: 83
Triglycerides: 61 (ref 40–160)

## 2019-10-09 LAB — BASIC METABOLIC PANEL
BUN: 15 (ref 4–21)
Chloride: 105 (ref 99–108)
Creatinine: 0.8 (ref 0.5–1.1)
Potassium: 3.8 (ref 3.4–5.3)
Sodium: 139 (ref 137–147)

## 2019-10-09 LAB — VITAMIN D 25 HYDROXY (VIT D DEFICIENCY, FRACTURES): Vit D, 25-Hydroxy: 63

## 2019-10-09 LAB — HEPATIC FUNCTION PANEL
ALT: 19 (ref 7–35)
AST: 23 (ref 13–35)

## 2019-10-09 LAB — CBC: RBC: 12.3 — AB (ref 3.87–5.11)

## 2019-10-09 LAB — TSH: TSH: 1.28 (ref 0.41–5.90)

## 2019-10-21 ENCOUNTER — Ambulatory Visit (HOSPITAL_COMMUNITY): Payer: Medicare Other

## 2019-10-28 ENCOUNTER — Ambulatory Visit (HOSPITAL_COMMUNITY)
Admission: RE | Admit: 2019-10-28 | Discharge: 2019-10-28 | Disposition: A | Discharge status: Home / Self Care | Payer: Medicare Other | Source: Ambulatory Visit | Attending: Cardiovascular Disease | Admitting: Cardiovascular Disease

## 2019-10-28 ENCOUNTER — Other Ambulatory Visit: Payer: Self-pay

## 2019-10-28 DIAGNOSIS — I712 Thoracic aortic aneurysm, without rupture: Secondary | ICD-10-CM

## 2019-10-28 DIAGNOSIS — I7121 Aneurysm of the ascending aorta, without rupture: Secondary | ICD-10-CM

## 2019-10-28 LAB — CREATININE, SERUM
Creatinine, Ser: 0.92 mg/dL (ref 0.44–1.00)
GFR calc Af Amer: >60 mL/min (ref >60)
GFR calc non Af Amer: >60 mL/min (ref >60)

## 2019-10-28 MED ORDER — GADOBUTROL 1 MMOL/ML IV SOLN
8.0000 mL | Freq: Once | INTRAVENOUS | Status: AC | PRN
  Administered 2019-10-28: 8 mL via INTRAVENOUS

## 2019-11-02 ENCOUNTER — Ambulatory Visit (INDEPENDENT_AMBULATORY_CARE_PROVIDER_SITE_OTHER): Payer: Medicare Other | Admitting: Cardiovascular Disease

## 2019-11-02 ENCOUNTER — Encounter: Payer: Self-pay | Admitting: Cardiovascular Disease

## 2019-11-02 ENCOUNTER — Other Ambulatory Visit: Payer: Self-pay

## 2019-11-02 VITALS — BP 122/74 | HR 65 | Ht 65.0 in | Wt 148.8 lb

## 2019-11-02 DIAGNOSIS — E782 Mixed hyperlipidemia: Secondary | ICD-10-CM | POA: Diagnosis not present

## 2019-11-02 DIAGNOSIS — I10 Essential (primary) hypertension: Secondary | ICD-10-CM | POA: Diagnosis not present

## 2019-11-02 DIAGNOSIS — I2510 Atherosclerotic heart disease of native coronary artery without angina pectoris: Secondary | ICD-10-CM | POA: Diagnosis not present

## 2019-11-02 DIAGNOSIS — I712 Thoracic aortic aneurysm, without rupture: Secondary | ICD-10-CM

## 2019-11-02 DIAGNOSIS — I7121 Aneurysm of the ascending aorta, without rupture: Secondary | ICD-10-CM

## 2019-11-02 NOTE — Patient Instructions (Signed)
Medication Instructions:  Your provider recommends that you continue on your current medications as directed. Please refer to the Current Medication list given to you today.    Testing/Procedures: Dr. Burt Knack recommends you have another MRA prior to your visit next year.  Follow-Up: Your provider wants you to follow-up in: 1 year with Dr. Burt Knack. You will receive a reminder letter in the mail two months in advance. If you don't receive a letter, please call our office to schedule the follow-up appointment.

## 2019-11-02 NOTE — Progress Notes (Signed)
Cardiology Office Note:    Date:  11/02/2019   ID:  Ruth Shields, DOB 01/28/1946, MRN [id number]  PCP:  Ruth Olp, MD  Cardiologist:  Ruth Mocha, MD  Electrophysiologist:  None   Referring MD: Ruth Olp, MD   Chief Complaint  Patient presents with  . Thoracic Aortic Aneurysm    History of Present Illness:    Ruth Shields is a 73 y.o. female with a hx of mild nonobstructive coronary artery disease and ascending aortic aneurysm, presenting for follow-up evaluation.  The patient is here alone today.  She has been feeling very well.  She exercises regularly with no exertional symptoms.  She denies chest pain, chest pressure, shortness of breath, lightheadedness, or heart palpitations.  She had an MRA of the chest prior to her office visit which demonstrated stability of the ascending aortic fusiform aneurysm at 4.4 x 4.5 cm in maximal dimension.  Past Medical History:  Diagnosis Date  . Cancer ([place])   . Hypertension   . Premature ventricular contractions (PVCs) (VPCs)     Past Surgical History:  Procedure Laterality Date  . BREAST BIOPSY     x2- benign  . BREAST EXCISIONAL BIOPSY Right    20 years ago x 2  . COLONOSCOPY    . COLONOSCOPY W/ BIOPSIES AND POLYPECTOMY  2004   Dr. Cristal Ford, [place]   . HERNIA REPAIR     left  . KNEE ARTHROSCOPY     left and right- torn meniscus- [facility]  . LEFT HEART CATH AND CORONARY ANGIOGRAPHY N/A 05/28/2017   Procedure: Left Heart Cath and Coronary Angiography;  Surgeon: Ruth Mocha, MD;  Location: [facility];  Service: Cardiovascular;  Laterality: N/A;  . POLYPECTOMY    . SKIN CANCER EXCISION     Dr. Delman Cheadle    Current Medications: Current Meds  Medication Sig  . calcium carbonate (OS-CAL) 1250 (500 Ca) MG chewable tablet Chew 1 tablet by mouth daily.  . diclofenac (VOLTAREN) 75 MG EC tablet Take 75 mg by mouth 2 (two) times daily as needed (headaches).   Marland Kitchen estradiol (VIVELLE-DOT) 0.0375  MG/24HR Place 1 patch onto the skin 2 (two) times a week.  . Multiple Vitamins-Minerals (MULTIVITAMIN WITH MINERALS) tablet Take 1 tablet by mouth daily.  . nitroGLYCERIN (NITROSTAT) 0.4 MG SL tablet Place 1 tablet (0.4 mg total) under the tongue every 5 (five) minutes as needed for chest pain.  . pantoprazole (PROTONIX) 40 MG tablet TAKE 1 TABLET DAILY AS NEEDED FOR REFLUX if pepcid not effective  . progesterone (PROMETRIUM) 100 MG capsule Take 100 mg by mouth every evening.   . rosuvastatin (CRESTOR) 5 MG tablet Take 1 tablet (5 mg total) by mouth daily.  Marland Kitchen senna (SENOKOT) 8.6 MG tablet Take 1 tablet by mouth daily.  Marland Kitchen spironolactone (ALDACTONE) 25 MG tablet Take 1 tablet (25 mg total) by mouth daily.  . valsartan-hydrochlorothiazide (DIOVAN-HCT) 80-12.5 MG tablet Take 1 tablet by mouth daily.  . Vitamin D, Ergocalciferol, (DRISDOL) 50000 UNITS CAPS capsule Take 50,000 Units by mouth every 7 (seven) days.   Current Facility-Administered Medications for the 11/02/19 encounter (Office Visit) with Ruth Mocha, MD  Medication  . 0.9 %  sodium chloride infusion     Allergies:   Patient has no known allergies.   Social History   Socioeconomic History  . Marital status: Married    Spouse name: Not on file  . Number of children: Not on file  . Years of education:  Not on file  . Highest education level: Not on file  Occupational History  . Not on file  Social Needs  . Financial resource strain: Not on file  . Food insecurity    Worry: Not on file    Inability: Not on file  . Transportation needs    Medical: Not on file    Non-medical: Not on file  Tobacco Use  . Smoking status: Never Smoker  . Smokeless tobacco: Never Used  Substance and Sexual Activity  . Alcohol use: Yes    Alcohol/week: 7.0 standard drinks    Types: 7 Glasses of wine per week  . Drug use: No  . Sexual activity: Not on file  Lifestyle  . Physical activity    Days per week: Not on file    Minutes per  session: Not on file  . Stress: Not on file  Relationships  . Social Herbalist on phone: Not on file    Gets together: Not on file    Attends religious service: Not on file    Active member of club or organization: Not on file    Attends meetings of clubs or organizations: Not on file    Relationship status: Not on file  Other Topics Concern  . Not on file  Social History Narrative   Married. 2 children (45 son with 46 sons, 45 year old married- horse and dogs) in 2017.    Lives in [place].       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 There is no history of Colon cancer, Stomach cancer, Rectal cancer, or Esophageal cancer.  ROS:   Please see the history of present illness.    All other systems reviewed and are negative.  EKGs/Labs/Other Studies Reviewed:    The following studies were reviewed today: MRA of the chest: FINDINGS: VASCULAR  Aorta: Fusiform aneurysmal dilation of the tubular portion of the ascending thoracic aorta with maximal transverse diameter of 4.4 x 4.5 cm at the level of the right main pulmonary artery. There is no dilation of the aortic root which measures up to 3.3 cm in transverse diameter. No effacement of the sino-tubular junction. Ductus bump noted incidentally. No evidence of aortic dissection. Conventional 3 vessel arch anatomy.  Heart: The heart is normal in size.  No focal abnormality.  Pulmonary Arteries:  Pulmonary arteries are normal in caliber.  Other: None.  NON-VASCULAR   Lungs/pleura: No focal signal abnormality, abnormal enhancement or evidence of pleural effusion.  Mediastinum: No mass lesion or lymphadenopathy. Unremarkable thoracic esophagus.  Musculoskeletal: No focal signal abnormality or abnormal enhancement.  Upper abdomen: Incidentally and incompletely evaluated nonenhancing T2 hyperintense T1 hypointense mass in hepatic segment 6 consistent with a 3.3 cm simple cyst. Nonenhancing T1 hypointense cystic structures incidentally noted in the left kidney. Probable cholelithiasis.  IMPRESSION: VASCULAR  1. Stable to incrementally enlarged fusiform thoracic aortic aneurysm with maximal dimensions of 4.4 x 4.5 cm compared to 4.3 x 4.4 cm in November of 2019. Recommend annual imaging followup by  CTA or MRA. This recommendation follows 2010 ACCF/AHA/AATS/ACR/ASA/SCA/SCAI/SIR/STS/SVM Guidelines for the Diagnosis and Management of Patients with Thoracic Aortic Disease. Circulation. 2010; [id number]. Aortic aneurysm NOS (ICD10-I71.9).  NON-VASCULAR  1. Incompletely imaged structure within the gallbladder lumen. Statistically, this likely represents cholelithiasis. 2. Probable benign hepatic and left renal cysts.  EKG:  EKG is ordered today.  The ekg ordered today demonstrates normal sinus rhythm 65 bpm, possible age-indeterminate septal infarct, otherwise normal  Recent Labs: 10/28/2019: Creatinine, Ser 0.92  Recent Lipid Panel    Component Value Date/Time   CHOL 221 (A) 08/25/2018   TRIG 66 08/25/2018   HDL 91 (A) 08/25/2018   LDLCALC 117 08/25/2018    Physical Exam:    VS:  BP 122/74   Pulse 65   Ht 5\' 5"  (1.651 m)   Wt 148 lb 12.8 oz (67.5 kg)   SpO2 98%   BMI 24.76 kg/m     Wt Readings from Last 3 Encounters:  11/02/19 148 lb 12.8 oz (67.5 kg)  03/19/19 144 lb (65.3 kg)  09/17/18 146 lb (66.2 kg)     GEN:  Well nourished, well developed in no acute distress HEENT: Normal NECK: No JVD; No carotid bruits  LYMPHATICS: No lymphadenopathy CARDIAC: RRR, no murmurs, rubs, gallops RESPIRATORY:  Clear to auscultation without rales, wheezing or rhonchi  ABDOMEN: Soft, non-tender, non-distended MUSCULOSKELETAL:  No edema; No deformity  SKIN: Warm and dry NEUROLOGIC:  Alert and oriented x 3 PSYCHIATRIC:  Normal affect   ASSESSMENT:    1. Aneurysm of ascending aorta (HCC)   2. Essential hypertension   3. Mixed hyperlipidemia    PLAN:    In order of problems listed above:  1. Stable findings on MRA of the chest with 1 mm incremental increase from last year's study.  Blood pressure is under ideal control.  Will repeat chest MRA in 1 year.  Discussed potential symptoms related to aortic aneurysm with the patient.  Discussed natural history with the patient today. 2. Blood pressure is well controlled on valsartan, hydrochlorothiazide, and spironolactone. 3. Lipids are excellent with a cholesterol of 174, triglycerides 61, HDL 79, and LDL 83 mg/dL on rosuvastatin 5 mg daily.  She admits to myalgias in the upper legs that bother her in the evening but she states that this is tolerable and would like to continue on her current statin agent.  For follow-up, would like to see the patient back in 1 year with an MRI of the chest prior to the office visit.   Medication Adjustments/Labs and Tests Ordered: Current medicines are reviewed at length with the patient today.  Concerns regarding medicines are outlined above.  Orders Placed This Encounter  Procedures  . MR ANGIO CHEST W WO CONTRAST  . EKG 12-Lead   No orders of the defined types were placed in this encounter.   Patient Instructions  Medication Instructions:  Your provider recommends that you continue on your current medications as directed. Please refer to the Current Medication list given to you today.    Testing/Procedures: Dr. Burt Knack recommends you have another MRA prior to your visit next year.  Follow-Up: Your provider wants you to  follow-up in: 1 year with Dr. Burt Knack. You will receive a reminder letter in the mail two months in advance. If you don't receive a letter, please call our office to schedule the follow-up appointment.      Signed, Ruth Mocha, MD  11/02/2019 4:38 PM    [place]

## 2019-11-17 ENCOUNTER — Telehealth: Payer: Self-pay | Admitting: Family Medicine

## 2019-11-17 NOTE — Telephone Encounter (Signed)
I left a message asking the patient to call and schedule Medicare AWV with Courtney ([facility]).  If patient calls back, please schedule Medicare Wellness Visit (initial) at next available opening.  VDM (Dee-Dee) 

## 2019-12-07 ENCOUNTER — Encounter: Payer: Self-pay | Admitting: Family Medicine

## 2020-04-11 ENCOUNTER — Ambulatory Visit: Payer: Medicare Other | Admitting: Family Medicine

## 2020-04-11 ENCOUNTER — Ambulatory Visit: Payer: Medicare Other

## 2020-05-19 ENCOUNTER — Other Ambulatory Visit: Payer: Self-pay

## 2020-05-19 ENCOUNTER — Encounter: Payer: Self-pay | Admitting: Family Medicine

## 2020-05-19 ENCOUNTER — Ambulatory Visit (INDEPENDENT_AMBULATORY_CARE_PROVIDER_SITE_OTHER): Payer: Medicare Other | Admitting: Family Medicine

## 2020-05-19 ENCOUNTER — Telehealth: Payer: Self-pay | Admitting: *Deleted

## 2020-05-19 ENCOUNTER — Ambulatory Visit: Payer: Medicare Other

## 2020-05-19 VITALS — BP 118/60 | HR 63 | Temp 98.3°F | Ht 65.0 in | Wt 141.4 lb

## 2020-05-19 DIAGNOSIS — I7121 Aneurysm of the ascending aorta, without rupture: Secondary | ICD-10-CM

## 2020-05-19 DIAGNOSIS — I10 Essential (primary) hypertension: Secondary | ICD-10-CM

## 2020-05-19 DIAGNOSIS — K219 Gastro-esophageal reflux disease without esophagitis: Secondary | ICD-10-CM | POA: Diagnosis not present

## 2020-05-19 DIAGNOSIS — I712 Thoracic aortic aneurysm, without rupture: Secondary | ICD-10-CM

## 2020-05-19 DIAGNOSIS — I2510 Atherosclerotic heart disease of native coronary artery without angina pectoris: Secondary | ICD-10-CM

## 2020-05-19 DIAGNOSIS — Z0000 Encounter for general adult medical examination without abnormal findings: Secondary | ICD-10-CM

## 2020-05-19 DIAGNOSIS — Z23 Encounter for immunization: Secondary | ICD-10-CM | POA: Diagnosis not present

## 2020-05-19 DIAGNOSIS — E785 Hyperlipidemia, unspecified: Secondary | ICD-10-CM

## 2020-05-19 DIAGNOSIS — M810 Age-related osteoporosis without current pathological fracture: Secondary | ICD-10-CM | POA: Diagnosis not present

## 2020-05-19 LAB — COMPREHENSIVE METABOLIC PANEL
ALT: 12 U/L (ref 0–35)
AST: 21 U/L (ref 0–37)
Albumin: 4.2 g/dL (ref 3.5–5.2)
Alkaline Phosphatase: 33 U/L — ABNORMAL LOW (ref 39–117)
BUN: 18 mg/dL (ref 6–23)
CO2: 30 mEq/L (ref 19–32)
Calcium: 9.6 mg/dL (ref 8.4–10.5)
Chloride: 101 mEq/L (ref 96–112)
Creatinine, Ser: 0.79 mg/dL (ref 0.40–1.20)
GFR: 71.58 mL/min (ref >60.00)
Glucose, Bld: 83 mg/dL (ref 70–99)
Potassium: 3.8 mEq/L (ref 3.5–5.1)
Sodium: 138 mEq/L (ref 135–145)
Total Bilirubin: 0.5 mg/dL (ref 0.2–1.2)
Total Protein: 6.5 g/dL (ref 6.0–8.3)

## 2020-05-19 LAB — CBC
HCT: 36.6 % (ref 36.0–46.0)
Hemoglobin: 12.6 g/dL (ref 12.0–15.0)
MCHC: 34.3 g/dL (ref 30.0–36.0)
MCV: 94.8 fl (ref 78.0–100.0)
Platelets: 256 10*3/uL (ref 150.0–400.0)
RBC: 3.86 Mil/uL — ABNORMAL LOW (ref 3.87–5.11)
RDW: 13 % (ref 11.5–15.5)
WBC: 5.9 10*3/uL (ref 4.0–10.5)

## 2020-05-19 LAB — LIPID PANEL
Cholesterol: 201 mg/dL — ABNORMAL HIGH (ref 0–200)
HDL: 66.8 mg/dL (ref >39.00)
LDL Cholesterol: 121 mg/dL — ABNORMAL HIGH (ref 0–99)
NonHDL: 133.71
Total CHOL/HDL Ratio: 3
Triglycerides: 65 mg/dL (ref 0.0–149.0)
VLDL: 13 mg/dL (ref 0.0–40.0)

## 2020-05-19 LAB — VITAMIN D 25 HYDROXY (VIT D DEFICIENCY, FRACTURES): VITD: 113.04 ng/mL (ref 30.00–100.00)

## 2020-05-19 MED ORDER — VALSARTAN-HYDROCHLOROTHIAZIDE 80-12.5 MG PO TABS: 1.0000 | ORAL_TABLET | Freq: Every day | ORAL | 3 refills | Status: DC

## 2020-05-19 NOTE — Progress Notes (Addendum)
Phone [phone]    Subjective:  Patient presents today for their annual wellness visit (initial )  Preventive Screening-Counseling & Management  Modifiable Risk Factors/behavioral risk assessment/psychosocial risk assessment Regular exercise: daily - pilates for 15-30 mins and tries to do 30 cardio Diet: reasonably healthy diet- down 5 lbs on home scales  Wt Readings from Last 3 Encounters:  05/19/20 141 lb 6.4 oz (64.1 kg)  11/02/19 148 lb 12.8 oz (67.5 kg)  03/19/19 144 lb (65.3 kg)   Smoking Status: Never Smoker Second Hand Smoking status: No smokers in home Alcohol intake: about 1 per night- or 7 per week. Occasionally twice a night- advised max 7 per week.   Cardiac risk factors:  Nonobstructive CAD advanced age (older than 51 for men, 68 for women)  treated Hyperlipidemia  treated Hypertension  No diabetes.  No results found for: HGBA1C Family History: no CAD/cva history    Depression Screen/risk evaluation Risk factors: none. PHQ2 0  Depression screen [facility] 2/9 05/19/2020 03/19/2019 11/13/2017 11/12/2016  Decreased Interest 0 0 0 0  Down, Depressed, Hopeless 0 0 0 -  PHQ - 2 Score 0 0 0 0    Functional ability and level of safety Mobility assessment:  timed get up and go <12 seconds Activities of Daily Living- Independent in ADLs (toileting, bathing, dressing, transferring, eating) and in IADLs (shopping, housekeeping, managing own medications, and handling finances) Home Safety: Loose rugs (no), smoke detectors (up to date), small pets (no), grab bars (no but not concerned), stairs ( some stairs but no issues), life-alert system (would use cell phone) Hearing Difficulties: -patient declines Fall Risk: None  Fall Risk  05/19/2020 10/30/2018 11/13/2017 11/12/2016  Falls in the past year? 0 0 No No  Comment - Emmi Telephone Survey: data to providers prior to load - -  Number falls in past yr: 0 - - -  Injury with Fall? 0 - - -  Opioid use history:  no long term opioids  use Self assessment of health status: "great"  Cognitive Testing             No reported trouble.   Mini cog: normal clock draw. 3/3 delayed recall. Normal test result   List the Names of Other Physician/Practitioners you currently use: Patient Care Team: Marin Olp, MD as PCP - General (Family Medicine) Sherren Mocha, MD as PCP - Cardiology (Cardiology) Dian Queen, MD as Consulting Physician (Obstetrics and Gynecology) Sharyne Peach, MD as Consulting Physician (Ophthalmology) Ladene Artist, MD as Consulting Physician (Gastroenterology)  Required Immunizations needed today:   Has already had pneumovax 23- unfortunately we could not find in records and ultimately decided to give today.  2nd shingrix  Immunization History  Administered Date(s) Administered  . Influenza-Unspecified 08/10/2014, 09/08/2014, 10/04/2015, 09/26/2016, 09/25/2017, 09/28/2019  . Moderna SARS-COVID-2 Vaccination 12/17/2019, 01/14/2020  . Pneumococcal Conjugate-13 04/28/2014  . Td 12/10/2008, 03/24/2019  . Tdap 04/14/2019  . Zoster Recombinat (Shingrix) 09/03/2017   Health Maintenance  Topic Date Due  . Pneumonia vaccines (2 of 2 - PPSV23) 04/29/2015  . Flu Shot  07/10/2020  . Mammogram  10/04/2021  . Colon Cancer Screening  05/10/2022  . Tetanus Vaccine  04/13/2029  . DEXA scan (bone density measurement)  Completed  . COVID-19 Vaccine  Completed  .  Hepatitis C: One time screening is recommended by Center for Disease Control  (CDC) for  adults born from [birth year] through 1963.   Addressed    Screening tests-  1. Colon cancer screening- May 10, 2017 with 5-year repeat due to follow with history 2. Lung Cancer screening-never smoker 3. Skin cancer screening- follows with dermatology regularly Dr. Delman Cheadle 4. Cervical cancer screening- still sees Dr. Runell Gess and gets Pap smears.  Also on progesterone/estrogen  5. Breast cancer screening- breast exam through gynecology.  Mammogram10/26/2020     The following were reviewed and entered/updated in epic if appropriate: Past Medical History:  Diagnosis Date  . Cancer ([place])   . Hypertension   . Premature ventricular contractions (PVCs) (VPCs)    Patient Active Problem List   Diagnosis Date Noted  . Nonobstructive CAD per cath 2018-Dr. Burt Knack 03/19/2019    Priority: High  . Aneurysm of ascending aorta (HCC) 05/23/2017    Priority: High  . Hyperlipidemia 11/13/2017    Priority: Medium  . Osteoporosis 11/12/2016    Priority: Medium  . Hormone replacement therapy 11/12/2016    Priority: Medium  . Essential hypertension 03/21/2010    Priority: Medium  . GERD (gastroesophageal reflux disease) 11/12/2016    Priority: Low  . Palpitations 07/31/2014    Priority: Low  . MURMUR 03/21/2010    Priority: Low  . Abnormal stress echo 05/28/2017   Past Surgical History:  Procedure Laterality Date  . BREAST BIOPSY     x2- benign  . BREAST EXCISIONAL BIOPSY Right    20 years ago x 2  . COLONOSCOPY    . COLONOSCOPY W/ BIOPSIES AND POLYPECTOMY  2004   Dr. Cristal Ford, [place]   . HERNIA REPAIR     left  . KNEE ARTHROSCOPY     left and right- torn meniscus- [place] [facility]  . LEFT HEART CATH AND CORONARY ANGIOGRAPHY N/A 05/28/2017   Procedure: Left Heart Cath and Coronary Angiography;  Surgeon: Sherren Mocha, MD;  Location: [facility];  Service: Cardiovascular;  Laterality: N/A;  . POLYPECTOMY    . SKIN CANCER EXCISION     Dr. Delman Cheadle    Family History  Problem Relation Age of Onset  . Atrial fibrillation Mother        86 and independent  . Cancer Father        lymphoma  . Hypertension Father   . Parkinson's disease Father        40  . Healthy Brother   . Other Brother        died in [place]  . Colon cancer Neg Hx   . Stomach cancer Neg Hx   . Rectal cancer Neg Hx   . Esophageal cancer Neg Hx     Medications- reviewed and updated Current Outpatient Medications  Medication Sig Dispense Refill  .  calcium carbonate (OS-CAL) 1250 (500 Ca) MG chewable tablet Chew 1 tablet by mouth daily.    . diclofenac (VOLTAREN) 75 MG EC tablet Take 75 mg by mouth 2 (two) times daily as needed (headaches).     Marland Kitchen estradiol (VIVELLE-DOT) 0.0375 MG/24HR Place 1 patch onto the skin 2 (two) times a week.    . Multiple Vitamins-Minerals (MULTIVITAMIN WITH MINERALS) tablet Take 1 tablet by mouth daily.    . nitroGLYCERIN (NITROSTAT) 0.4 MG SL tablet Place 1 tablet (0.4 mg total) under the tongue every 5 (five) minutes as needed for chest pain. 25 tablet 3  . pantoprazole (PROTONIX) 40 MG tablet TAKE 1 TABLET DAILY AS NEEDED FOR REFLUX if pepcid not effective 90 tablet 1  . progesterone (PROMETRIUM) 100 MG capsule Take 100 mg by mouth every evening.     . rosuvastatin (CRESTOR)  5 MG tablet Take 1 tablet (5 mg total) by mouth daily. 90 tablet 2  . senna (SENOKOT) 8.6 MG tablet Take 1 tablet by mouth daily.    Marland Kitchen spironolactone (ALDACTONE) 25 MG tablet Take 1 tablet (25 mg total) by mouth daily. 90 tablet 3  . valsartan-hydrochlorothiazide (DIOVAN-HCT) 80-12.5 MG tablet Take 1 tablet by mouth daily. 90 tablet 3  . Vitamin D, Ergocalciferol, (DRISDOL) 50000 UNITS CAPS capsule Take 50,000 Units by mouth every 7 (seven) days.     Allergies-reviewed and updated No Known Allergies  Social History   Socioeconomic History  . Marital status: Married    Spouse name: Not on file  . Number of children: Not on file  . Years of education: Not on file  . Highest education level: Not on file  Occupational History  . Not on file  Tobacco Use  . Smoking status: Never Smoker  . Smokeless tobacco: Never Used  Substance and Sexual Activity  . Alcohol use: Yes    Alcohol/week: 7.0 standard drinks    Types: 7 Glasses of wine per week  . Drug use: No  . Sexual activity: Not on file  Other Topics Concern  . Not on file  Social History Narrative   Married. 2 children (62 son with 40 sons, 10 year old married- horse and dogs)  in 2017.    Lives in [place].       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 Difficulty of Paying Living Expenses: no  Food Insecurity:   . Worried About Charity fundraiser in the Last Year: no  . Arboriculturist in the Last Year: no  Transportation Needs:   . Film/video editor (Medical): no  . Lack of Transportation (Non-Medical): no      Objective:  BP 118/60   Pulse 63   Temp 98.3 F (36.8 C)   Ht 5\' 5"  (1.651 m)   Wt 141 lb 6.4 oz (64.1 kg)   SpO2 98%   BMI 23.53 kg/m  Gen: NAD, resting comfortably   Assessment/Plan:  AWV completed 1. Educated, counseled and referred based on above elements 2. Educated, counseled and referred as appropriate for preventative needs 3. Discussed and documented a written plan for preventiative services and screenings with personalized health advice- After Visit Summary was given to patient which included this plan   Status of chronic or acute concerns  See problem oriented separate note  Recommended follow up: 1 year awv   Lab/Order associations:   ICD-10-CM   1. Preventative health care  Z00.00    Return precautions advised.  Garret Reddish, MD

## 2020-05-19 NOTE — Telephone Encounter (Signed)
See result note.  

## 2020-05-19 NOTE — Progress Notes (Addendum)
Phone [phone] In person visit   Subjective:   Ruth Shields is a 74 y.o. year old very pleasant female patient who presents for/with See problem oriented charting Chief Complaint  Patient presents with  . Follow-up  . AWV    This visit occurred during the SARS-CoV-2 public health emergency.  Safety protocols were in place, including screening questions prior to the visit, additional usage of staff PPE, and extensive cleaning of exam room while observing appropriate contact time as indicated for disinfecting solutions.   Past Medical History-  Patient Active Problem List   Diagnosis Date Noted  . Nonobstructive CAD per cath 2018-Dr. Burt Knack 03/19/2019    Priority: High  . Aneurysm of ascending aorta (HCC) 05/23/2017    Priority: High  . Hyperlipidemia 11/13/2017    Priority: Medium  . Osteoporosis 11/12/2016    Priority: Medium  . Hormone replacement therapy 11/12/2016    Priority: Medium  . Essential hypertension 03/21/2010    Priority: Medium  . GERD (gastroesophageal reflux disease) 11/12/2016    Priority: Low  . Palpitations 07/31/2014    Priority: Low  . MURMUR 03/21/2010    Priority: Low  . Abnormal stress echo 05/28/2017    Medications- reviewed and updated Current Outpatient Medications  Medication Sig Dispense Refill  . calcium carbonate (OS-CAL) 1250 (500 Ca) MG chewable tablet Chew 1 tablet by mouth daily.    . diclofenac (VOLTAREN) 75 MG EC tablet Take 75 mg by mouth 2 (two) times daily as needed (headaches).     Marland Kitchen estradiol (VIVELLE-DOT) 0.0375 MG/24HR Place 1 patch onto the skin 2 (two) times a week.    . Multiple Vitamins-Minerals (MULTIVITAMIN WITH MINERALS) tablet Take 1 tablet by mouth daily.    . nitroGLYCERIN (NITROSTAT) 0.4 MG SL tablet Place 1 tablet (0.4 mg total) under the tongue every 5 (five) minutes as needed for chest pain. 25 tablet 3  . progesterone (PROMETRIUM) 100 MG capsule Take 100 mg by mouth every evening.     . rosuvastatin  (CRESTOR) 5 MG tablet Take 1 tablet (5 mg total) by mouth daily. 90 tablet 2  . senna (SENOKOT) 8.6 MG tablet Take 1 tablet by mouth daily.    Marland Kitchen spironolactone (ALDACTONE) 25 MG tablet Take 1 tablet (25 mg total) by mouth daily. 90 tablet 3  . valsartan-hydrochlorothiazide (DIOVAN-HCT) 80-12.5 MG tablet Take 1 tablet by mouth daily. 90 tablet 3  . Vitamin D, Ergocalciferol, (DRISDOL) 50000 UNITS CAPS capsule Take 50,000 Units by mouth every 7 (seven) days.     No current facility-administered medications for this visit.     Objective:  BP 118/60   Pulse 63   Temp 98.3 F (36.8 C)   Ht 5\' 5"  (1.651 m)   Wt 141 lb 6.4 oz (64.1 kg)   SpO2 98%   BMI 23.53 kg/m  Gen: NAD, resting comfortably HEENT: Mucous membranes are moist. Oropharynx normal Neck: no thyromegaly CV: RRR no murmurs rubs or gallops Lungs: CTAB no crackles, wheeze, rhonchi Abdomen: soft/nontender/nondistended/normal bowel sounds. No rebound or guarding.  Ext: no edema Skin: warm, dry Neuro: grossly normal, moves all extremities, PERRLA    Assessment and Plan   #hypertension S: medication: spironolactone 25 mg, valsartan hydrochlorothiazide 80-12.5 mg Home readings #s: good when checks at home- sparing checks BP Readings from Last 3 Encounters:  05/19/20 118/60  11/02/19 122/74  03/19/19 111/73  A/P: Stable. Continue current medications.   #Coronary artery disease-nonobstructive primarily affecting the LAD based on catheterization with Dr. Burt Knack  2018. S: Patient is compliant with rosuvastatin 5 mg (takes 4 days a week) daily and aspirin- caused a lot of bruising but willing to try twice a week  -Sparing nitroglycerin is helpful in past- not recently using. Lab Results  Component Value Date   CHOL 174 10/09/2019   HDL 79 (A) 10/09/2019   LDLCALC 83 10/09/2019   TRIG 61 10/09/2019  A/P: Coronary artery disease without any recent chest pain or shortness of breath and has not had to use nitroglycerin.  She  tried to use aspirin but had a lot of bruising-we are going to try twice a week instead.  She is compliant with rosuvastatin 5 mg-we will check lipid panel today with ideal goal 70 or less-cardiology has been okay with numbers in the 80s so unlikely to increase the dose- shes on 4 days a week-in addition she does get myalgias from time to time so hesitant to increase dose   # GERD S: Medication: rarely needing pepcid recently A/P: Excellent control sparing use of Pepcid-we will continue this  #osteoporosis S: Medication: 50k units once a week. Off fosamax Last vitamin D Lab Results  Component Value Date   VD25OH 16 10/09/2019  A/P: has had dexa in past with Dr. Runell Gess- last one was in [place]- she is going to try to get an update through Dr. Runell Gess which is most ideal to compare same machine. Has been on fosamax in past- last bone density looked good to me with worst t score -1.2.     #Ascending aortic aneurysm S: Incidental finding during chest pain work-up May 21 2017-4.3 cm.  Follows with Dr. Burt Knack.  Last imaging October 28, 2019 with 1 year repeat planned MR angiogram-noted at 4.4 x 4.5 cm-very slight increase from 4.3 x 4.4 cm previously A/P: doing well- continue current meds for BP control  Recommended follow up:  1 year AWV and follow up. Offered 6 months and we could recheck electrolytes.   Lab/Order associations:   ICD-10-CM   2. Hyperlipidemia, unspecified hyperlipidemia type  E78.5 CBC    Comprehensive metabolic panel    Lipid panel  3. Gastroesophageal reflux disease without esophagitis  K21.9   4. Age-related osteoporosis without current pathological fracture  M81.0   5. Essential hypertension  I10   6. Need for shingles vaccine  Z23   8. Aneurysm of ascending aorta (HCC) Chronic I71.2    Return precautions advised.  Garret Reddish, MD

## 2020-05-19 NOTE — Telephone Encounter (Signed)
Sa from [facility] lab call with critical lab results  Vit D 113.04

## 2020-05-19 NOTE — Patient Instructions (Addendum)
Please stop by lab before you go If you have mychart- we will send your results within 3 business days of [place] receiving them.  If you do not have mychart- we will call you about results within 5 business days of [place] receiving them.   Pneumovax 23 today, get [place] the records of your 2nd shingles shot.  Try Aspirin 81Mg  twice a week.  Ruth Shields , Thank you for taking time to come for your Medicare Wellness Visit. I appreciate your ongoing commitment to your health goals. Please review the following plan we discussed and let me know if I can assist you in the future.   These are the goals we discussed: 1. Keep up the great job with exercise! And healthy eating   This is a list of the screening recommended for you and due dates:  Health Maintenance  Topic Date Due  . Pneumonia vaccines (2 of 2 - PPSV23)-has had in [facility] 04/29/2015  . Colon Cancer Screening -due in a couple years 05/10/2022  .  Hepatitis C: One time screening is recommended by Center for Disease Control  (CDC) for  adults born from [birth year] through 1963.   Addressed

## 2020-05-19 NOTE — Assessment & Plan Note (Signed)
S: Patient is compliant with rosuvastatin 5 mg (takes 4 days a week) daily and aspirin- caused a lot of bruising but willing to try twice a week  -Sparing nitroglycerin is helpful in past- not recently using. Lab Results  Component Value Date   CHOL 174 10/09/2019   HDL 79 (A) 10/09/2019   LDLCALC 83 10/09/2019   TRIG 61 10/09/2019  A/P: Coronary artery disease without any recent chest pain or shortness of breath and has not had to use nitroglycerin.  She tried to use aspirin but had a lot of bruising-we are going to try twice a week instead.  She is compliant with rosuvastatin 5 mg-we will check lipid panel today with ideal goal 70 or less-cardiology has been okay with numbers in the 80s so unlikely to increase the dose- shes on 4 days a week-in addition she does get myalgias from time to time so hesitant to increase dose

## 2020-06-01 ENCOUNTER — Encounter: Payer: Self-pay | Admitting: Family Medicine

## 2020-06-16 ENCOUNTER — Other Ambulatory Visit: Payer: Self-pay | Admitting: Family Medicine

## 2020-07-06 ENCOUNTER — Encounter: Payer: Self-pay | Admitting: Family Medicine

## 2020-07-06 ENCOUNTER — Other Ambulatory Visit: Payer: Self-pay | Admitting: Family Medicine

## 2020-07-06 MED ORDER — NITROGLYCERIN 0.4 MG SL SUBL: 0.4000 mg | SUBLINGUAL_TABLET | SUBLINGUAL | 3 refills | Status: DC | PRN

## 2020-09-05 ENCOUNTER — Other Ambulatory Visit: Payer: Self-pay | Admitting: Obstetrics and Gynecology

## 2020-09-05 DIAGNOSIS — Z0000 Encounter for general adult medical examination without abnormal findings: Secondary | ICD-10-CM

## 2020-10-11 ENCOUNTER — Ambulatory Visit: Payer: Medicare Other

## 2020-10-18 ENCOUNTER — Ambulatory Visit (HOSPITAL_COMMUNITY)
Admission: RE | Admit: 2020-10-18 | Discharge: 2020-10-18 | Disposition: A | Discharge status: Home / Self Care | Payer: Medicare Other | Source: Ambulatory Visit | Attending: Cardiovascular Disease | Admitting: Cardiovascular Disease

## 2020-10-18 DIAGNOSIS — I712 Thoracic aortic aneurysm, without rupture: Secondary | ICD-10-CM | POA: Diagnosis present

## 2020-10-18 DIAGNOSIS — I7121 Aneurysm of the ascending aorta, without rupture: Secondary | ICD-10-CM

## 2020-10-18 MED ORDER — GADOBUTROL 1 MMOL/ML IV SOLN
6.5000 mL | Freq: Once | INTRAVENOUS | Status: AC | PRN
  Administered 2020-10-18: 6.5 mL via INTRAVENOUS

## 2020-11-07 ENCOUNTER — Other Ambulatory Visit: Payer: Self-pay

## 2020-11-07 ENCOUNTER — Encounter: Payer: Self-pay | Admitting: Cardiovascular Disease

## 2020-11-07 ENCOUNTER — Ambulatory Visit (INDEPENDENT_AMBULATORY_CARE_PROVIDER_SITE_OTHER): Payer: Medicare Other | Admitting: Cardiovascular Disease

## 2020-11-07 VITALS — BP 116/70 | HR 75 | Ht 65.0 in | Wt 143.4 lb

## 2020-11-07 DIAGNOSIS — I712 Thoracic aortic aneurysm, without rupture: Secondary | ICD-10-CM

## 2020-11-07 DIAGNOSIS — I7121 Aneurysm of the ascending aorta, without rupture: Secondary | ICD-10-CM

## 2020-11-07 DIAGNOSIS — I2510 Atherosclerotic heart disease of native coronary artery without angina pectoris: Secondary | ICD-10-CM | POA: Diagnosis not present

## 2020-11-07 DIAGNOSIS — I10 Essential (primary) hypertension: Secondary | ICD-10-CM

## 2020-11-07 DIAGNOSIS — E782 Mixed hyperlipidemia: Secondary | ICD-10-CM

## 2020-11-07 NOTE — Progress Notes (Signed)
Cardiology Office Note:    Date:  11/08/2020   ID:  Ruth Shields, DOB 1946/05/15, MRN [id number]  PCP:  Marin Olp, MD  [facility] Cardiologist:  Sherren Mocha, MD  [place] Electrophysiologist:  None   Referring MD: Marin Olp, MD   Chief Complaint  Patient presents with  . Thoracic Aortic Aneurysm    History of Present Illness:    Ruth Shields is a 74 y.o. female with a hx of mild nonobstructive coronary artery disease and ascending aortic aneurysm, presenting for follow-up evaluation.  The patient has been followed with serial MRA studies over the last 3 years.  She was initially diagnosed with a dilated ascending aorta by CT in 2018.  Her recent study shows a maximal diameter of 4.3 cm which is essentially unchanged (last year 4.4 x 4.5 cm).  The patient is doing well.  She been under a lot of stress with moving her mother into assisted living.  She had not been taking her statin medication and noted an increase in her LDL cholesterol over the summer.  She has gone back to taking low-dose rosuvastatin twice per week.  The dose has been limited by side effects of myalgias in her thighs.  She has not had recent chest pain or pressure, dyspnea, edema, orthopnea, or PND.  She does have occasional ankle swelling with travel.  She exercises regularly with no exertional symptoms.  Past Medical History:  Diagnosis Date  . Cancer ([facility])   . Hypertension   . Premature ventricular contractions (PVCs) ([facility])     Past Surgical History:  Procedure Laterality Date  . BREAST BIOPSY     x2- benign  . BREAST EXCISIONAL BIOPSY Right    20 years ago x 2  . COLONOSCOPY    . COLONOSCOPY W/ BIOPSIES AND POLYPECTOMY  2004   Dr. Cristal Ford, [place]   . HERNIA REPAIR     left  . KNEE ARTHROSCOPY     left and right- torn meniscus- [facility]  . LEFT HEART CATH AND CORONARY ANGIOGRAPHY N/A 05/28/2017   Procedure: Left Heart Cath and Coronary Angiography;  Surgeon:  Sherren Mocha, MD;  Location: [facility];  Service: Cardiovascular;  Laterality: N/A;  . POLYPECTOMY    . SKIN CANCER EXCISION     Dr. Delman Cheadle    Current Medications: Current Meds  Medication Sig  . calcium carbonate (OS-CAL) 1250 (500 Ca) MG chewable tablet Chew 1 tablet by mouth daily.  . diclofenac (VOLTAREN) 75 MG EC tablet Take 75 mg by mouth 2 (two) times daily as needed (headaches).   Marland Kitchen estradiol (VIVELLE-DOT) 0.0375 MG/24HR Place 1 patch onto the skin 2 (two) times a week.  . Multiple Vitamins-Minerals (MULTIVITAMIN WITH MINERALS) tablet Take 1 tablet by mouth daily.  . nitroGLYCERIN (NITROSTAT) 0.4 MG SL tablet Place 1 tablet (0.4 mg total) under the tongue every 5 (five) minutes as needed for chest pain.  . progesterone (PROMETRIUM) 100 MG capsule Take 100 mg by mouth every evening.   . rosuvastatin (CRESTOR) 5 MG tablet Take 1 tablet (5 mg total) by mouth daily.  Marland Kitchen senna (SENOKOT) 8.6 MG tablet Take 1 tablet by mouth daily.  Marland Kitchen spironolactone (ALDACTONE) 25 MG tablet TAKE 1 TABLET DAILY  . valsartan-hydrochlorothiazide (DIOVAN-HCT) 80-12.5 MG tablet TAKE 1 TABLET BY MOUTH EVERY DAY  . Vitamin D, Ergocalciferol, (DRISDOL) 50000 UNITS CAPS capsule Take 50,000 Units by mouth every 7 (seven) days.     Allergies:  Patient has no known allergies.   Social History   Socioeconomic History  . Marital status: Married    Spouse name: Not on file  . Number of children: Not on file  . Years of education: Not on file  . Highest education level: Not on file  Occupational History  . Not on file  Tobacco Use  . Smoking status: Never Smoker  . Smokeless tobacco: Never Used  Substance and Sexual Activity  . Alcohol use: Yes    Alcohol/week: 7.0 standard drinks    Types: 7 Glasses of wine per week  . Drug use: No  . Sexual activity: Not on file  Other Topics Concern  . Not on file  Social History Narrative   Married. 2 children (52 son with 106 sons, 30 year old married-  horse and dogs) in 2017.    Lives in [place].       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 Difficulty of Paying Living Expenses: Not on file  Food Insecurity:   . Worried About Charity fundraiser in the Last Year: Not on file  . Ran Out of Food in the Last Year: Not on file  Transportation Needs:   . Lack of Transportation (Medical): Not on file  . Lack of Transportation (Non-Medical): Not on file  Physical Activity:   . Days of Exercise per Week: Not on file  . Minutes of Exercise per Session: Not on file  Stress:   . Feeling of Stress : Not on file  Social Connections:   . Frequency of Communication with Friends and Family: Not on file  . Frequency of Social Gatherings with Friends and Family: Not on file  . Attends Religious Services: Not on file  . Active Member of Clubs or Organizations: Not on file  . Attends Archivist Meetings: Not on file  . Marital Status: Not on file     Family History: The patient's family history includes Atrial fibrillation in her mother; Cancer in her father; Healthy in her brother; Hypertension in her father; Other in her brother; Parkinson's disease in her father. There is no history of Colon cancer, Stomach cancer, Rectal cancer, or Esophageal cancer.  ROS:   Please see the history of present illness.    All other systems reviewed and are negative.  EKGs/Labs/Other Studies Reviewed:    The following studies were reviewed today: MRA Chest 10-18-2020: IMPRESSION: Maximum diameter estimated of the ascending aorta on the current MRA is 4.3 cm, essentially unchanged. Recommend annual imaging followup by CTA or MRA. This recommendation follows  2010 ACCF/AHA/AATS/ACR/ASA/SCA/SCAI/SIR/STS/SVM Guidelines for the Diagnosis and Management of Patients with Thoracic Aortic Disease. Circulation. 2010; 121: P329-J188. Aortic aneurysm NOS (ICD10-I71.9)  EKG:  EKG is ordered today.  The ekg ordered today demonstrates NSR 75 bpm, within normal limits  Recent Labs: 05/19/2020: ALT 12; BUN 18; Creatinine, Ser 0.79; Hemoglobin 12.6; Platelets 256.0; Potassium 3.8; Sodium 138  Recent Lipid Panel    Component Value Date/Time   CHOL 201 (H) 05/19/2020 1415   TRIG 65.0 05/19/2020 1415   HDL 66.80 05/19/2020 1415   CHOLHDL 3 05/19/2020 1415   VLDL 13.0 05/19/2020 1415  LDLCALC 121 (H) 05/19/2020 1415    Risk Assessment/Calculations:     Physical Exam:    VS:  BP 116/70   Pulse 75   Ht 5\' 5"  (1.651 m)   Wt 143 lb 6.4 oz (65 kg)   SpO2 98%   BMI 23.86 kg/m     Wt Readings from Last 3 Encounters:  11/07/20 143 lb 6.4 oz (65 kg)  05/19/20 141 lb 6.4 oz (64.1 kg)  11/02/19 148 lb 12.8 oz (67.5 kg)     GEN:  Well nourished, well developed in no acute distress HEENT: Normal NECK: No JVD; No carotid bruits LYMPHATICS: No lymphadenopathy CARDIAC: RRR, no murmurs, rubs, gallops RESPIRATORY:  Clear to auscultation without rales, wheezing or rhonchi  ABDOMEN: Soft, non-tender, non-distended MUSCULOSKELETAL:  No edema; No deformity  SKIN: Warm and dry NEUROLOGIC:  Alert and oriented x 3 PSYCHIATRIC:  Normal affect   ASSESSMENT:    1. Aneurysm of ascending aorta (HCC)   2. Essential hypertension   3. Mixed hyperlipidemia    PLAN:    In order of problems listed above:  1. We will see back next year for follow-up evaluation.  Will order another chest MRA in 2 years considering stability over serial studies now for several years.  Blood pressure is very well controlled on current medicines.  Patient is asymptomatic. 2. Blood pressure well controlled on spironolactone, valsartan, hydrochlorothiazide.  Recent labs reviewed with  creatinine 0.79 and potassium 3.8. 3. Treated with a statin drug, low-dose Crestor twice weekly with dose limitation secondary to side effects.  Patient has a high HDL cholesterol.  Lipids are followed by her primary physician.  Shared Decision Making/Informed Consent    Medication Adjustments/Labs and Tests Ordered: Current medicines are reviewed at length with the patient today.  Concerns regarding medicines are outlined above.  Orders Placed This Encounter  Procedures  . EKG 12-Lead   No orders of the defined types were placed in this encounter.   Patient Instructions  Medication Instructions:  Your provider recommends that you continue on your current medications as directed. Please refer to the Current Medication list given to you today.   *If you need a refill on your cardiac medications before your next appointment, please call your pharmacy*   Follow-Up: At [facility], you and your health needs are our priority.  As part of our continuing mission to provide you with exceptional heart care, we have created designated Provider Care Teams.  These Care Teams include your primary Cardiologist (physician) and Advanced Practice Providers (APPs -  Physician Assistants and Nurse Practitioners) who all work together to provide you with the care you need, when you need it. Your next appointment:   12 month(s) The format for your next appointment:   In Person Provider:   You may see Sherren Mocha, MD or one of the following Advanced Practice Providers on your designated Care Team:    Richardson Dopp, PA-C  Robbie Lis, [place]      Signed, Sherren Mocha, MD  11/08/2020 6:20 AM    [place]

## 2020-11-07 NOTE — Patient Instructions (Signed)

## 2020-11-11 LAB — VITAMIN D 25 HYDROXY (VIT D DEFICIENCY, FRACTURES): Vit D, 25-Hydroxy: 56

## 2020-11-21 ENCOUNTER — Ambulatory Visit
Admission: RE | Admit: 2020-11-21 | Discharge: 2020-11-21 | Disposition: A | Discharge status: Home / Self Care | Payer: Medicare Other | Source: Ambulatory Visit | Attending: Obstetrics and Gynecology | Admitting: Obstetrics and Gynecology

## 2020-11-21 ENCOUNTER — Other Ambulatory Visit: Payer: Self-pay

## 2020-11-21 DIAGNOSIS — Z0000 Encounter for general adult medical examination without abnormal findings: Secondary | ICD-10-CM

## 2020-11-25 ENCOUNTER — Other Ambulatory Visit: Payer: Self-pay | Admitting: Obstetrics and Gynecology

## 2020-11-25 DIAGNOSIS — R928 Other abnormal and inconclusive findings on diagnostic imaging of breast: Secondary | ICD-10-CM

## 2020-12-07 ENCOUNTER — Other Ambulatory Visit: Payer: Self-pay

## 2020-12-07 ENCOUNTER — Ambulatory Visit
Admission: RE | Admit: 2020-12-07 | Discharge: 2020-12-07 | Disposition: A | Discharge status: Home / Self Care | Payer: Medicare Other | Source: Ambulatory Visit | Attending: Obstetrics and Gynecology | Admitting: Obstetrics and Gynecology

## 2020-12-07 DIAGNOSIS — R928 Other abnormal and inconclusive findings on diagnostic imaging of breast: Secondary | ICD-10-CM

## 2020-12-15 ENCOUNTER — Other Ambulatory Visit: Payer: Self-pay | Admitting: Obstetrics and Gynecology

## 2020-12-15 DIAGNOSIS — R921 Mammographic calcification found on diagnostic imaging of breast: Secondary | ICD-10-CM

## 2021-05-26 ENCOUNTER — Other Ambulatory Visit: Payer: Self-pay | Admitting: Family Medicine

## 2021-05-30 ENCOUNTER — Telehealth: Payer: Self-pay | Admitting: Family Medicine

## 2021-05-30 NOTE — Telephone Encounter (Signed)
Copied from [place] [id number]. Topic: Medicare AWV >> May 30, 2021 11:45 AM Harris-Coley, Hannah Beat wrote: Reason for CRM: Left message for patient to schedule Annual Wellness Visit.  Please schedule with Nurse Health Advisor Charlott Rakes, RN at [facility].

## 2021-06-08 ENCOUNTER — Ambulatory Visit
Admission: RE | Admit: 2021-06-08 | Discharge: 2021-06-08 | Disposition: A | Discharge status: Home / Self Care | Payer: Medicare Other | Source: Ambulatory Visit | Attending: Obstetrics and Gynecology | Admitting: Obstetrics and Gynecology

## 2021-06-08 ENCOUNTER — Other Ambulatory Visit: Payer: Self-pay | Admitting: Obstetrics and Gynecology

## 2021-06-08 ENCOUNTER — Other Ambulatory Visit: Payer: Self-pay

## 2021-06-08 DIAGNOSIS — R921 Mammographic calcification found on diagnostic imaging of breast: Secondary | ICD-10-CM

## 2021-06-14 ENCOUNTER — Other Ambulatory Visit: Payer: Self-pay

## 2021-06-14 DIAGNOSIS — I712 Thoracic aortic aneurysm, without rupture, unspecified: Secondary | ICD-10-CM

## 2021-06-14 MED ORDER — ROSUVASTATIN CALCIUM 5 MG PO TABS: 5.0000 mg | ORAL_TABLET | Freq: Every day | ORAL | 3 refills | Status: DC

## 2021-06-22 ENCOUNTER — Encounter: Payer: Self-pay | Admitting: *Deleted

## 2021-06-22 ENCOUNTER — Other Ambulatory Visit: Payer: Self-pay | Admitting: *Deleted

## 2021-06-22 DIAGNOSIS — Z01812 Encounter for preprocedural laboratory examination: Secondary | ICD-10-CM

## 2021-06-23 ENCOUNTER — Other Ambulatory Visit: Payer: Self-pay | Admitting: *Deleted

## 2021-06-23 NOTE — Progress Notes (Deleted)
bmet  

## 2021-08-02 ENCOUNTER — Other Ambulatory Visit: Payer: Self-pay | Admitting: Family Medicine

## 2021-09-10 ENCOUNTER — Other Ambulatory Visit: Payer: Self-pay | Admitting: Family Medicine

## 2021-09-11 NOTE — Telephone Encounter (Signed)
Call patient, patient aware need OV for future refills  Call transfer to front office for appt

## 2021-09-11 NOTE — Telephone Encounter (Signed)
Last OV on 05/19/2020

## 2021-09-12 ENCOUNTER — Encounter: Payer: Self-pay | Admitting: Family Medicine

## 2021-09-12 ENCOUNTER — Ambulatory Visit (INDEPENDENT_AMBULATORY_CARE_PROVIDER_SITE_OTHER): Payer: Medicare Other | Admitting: Family Medicine

## 2021-09-12 ENCOUNTER — Other Ambulatory Visit: Payer: Self-pay

## 2021-09-12 VITALS — BP 108/69 | HR 75 | Temp 98.0°F | Ht 65.0 in | Wt 144.0 lb

## 2021-09-12 DIAGNOSIS — I7121 Aneurysm of the ascending aorta, without rupture: Secondary | ICD-10-CM

## 2021-09-12 DIAGNOSIS — I10 Essential (primary) hypertension: Secondary | ICD-10-CM

## 2021-09-12 DIAGNOSIS — M810 Age-related osteoporosis without current pathological fracture: Secondary | ICD-10-CM

## 2021-09-12 DIAGNOSIS — E785 Hyperlipidemia, unspecified: Secondary | ICD-10-CM

## 2021-09-12 DIAGNOSIS — I2510 Atherosclerotic heart disease of native coronary artery without angina pectoris: Secondary | ICD-10-CM

## 2021-09-12 DIAGNOSIS — K219 Gastro-esophageal reflux disease without esophagitis: Secondary | ICD-10-CM | POA: Diagnosis not present

## 2021-09-12 LAB — COMPREHENSIVE METABOLIC PANEL
ALT: 12 U/L (ref 0–35)
AST: 24 U/L (ref 0–37)
Albumin: 4 g/dL (ref 3.5–5.2)
Alkaline Phosphatase: 34 U/L — ABNORMAL LOW (ref 39–117)
BUN: 19 mg/dL (ref 6–23)
CO2: 29 mEq/L (ref 19–32)
Calcium: 9.6 mg/dL (ref 8.4–10.5)
Chloride: 103 mEq/L (ref 96–112)
Creatinine, Ser: 0.86 mg/dL (ref 0.40–1.20)
GFR: 67.17 mL/min (ref >60.00)
Glucose, Bld: 92 mg/dL (ref 70–99)
Potassium: 3.5 mEq/L (ref 3.5–5.1)
Sodium: 140 mEq/L (ref 135–145)
Total Bilirubin: 0.5 mg/dL (ref 0.2–1.2)
Total Protein: 6.5 g/dL (ref 6.0–8.3)

## 2021-09-12 LAB — VITAMIN D 25 HYDROXY (VIT D DEFICIENCY, FRACTURES): VITD: 99.88 ng/mL (ref 30.00–100.00)

## 2021-09-12 LAB — CBC WITH DIFFERENTIAL/PLATELET
Basophils Absolute: 0 10*3/uL (ref 0.0–0.1)
Basophils Relative: 0.7 % (ref 0.0–3.0)
Eosinophils Absolute: 0.1 10*3/uL (ref 0.0–0.7)
Eosinophils Relative: 1.3 % (ref 0.0–5.0)
HCT: 36.5 % (ref 36.0–46.0)
Hemoglobin: 12.2 g/dL (ref 12.0–15.0)
Lymphocytes Relative: 25.6 % (ref 12.0–46.0)
Lymphs Abs: 1.3 10*3/uL (ref 0.7–4.0)
MCHC: 33.6 g/dL (ref 30.0–36.0)
MCV: 94.3 fl (ref 78.0–100.0)
Monocytes Absolute: 0.4 10*3/uL (ref 0.1–1.0)
Monocytes Relative: 7.9 % (ref 3.0–12.0)
Neutro Abs: 3.4 10*3/uL (ref 1.4–7.7)
Neutrophils Relative %: 64.5 % (ref 43.0–77.0)
Platelets: 215 10*3/uL (ref 150.0–400.0)
RBC: 3.86 Mil/uL — ABNORMAL LOW (ref 3.87–5.11)
RDW: 13.6 % (ref 11.5–15.5)
WBC: 5.2 10*3/uL (ref 4.0–10.5)

## 2021-09-12 LAB — LIPID PANEL
Cholesterol: 186 mg/dL (ref 0–200)
HDL: 75.5 mg/dL (ref >39.00)
LDL Cholesterol: 92 mg/dL (ref 0–99)
NonHDL: 110.07
Total CHOL/HDL Ratio: 2
Triglycerides: 88 mg/dL (ref 0.0–149.0)
VLDL: 17.6 mg/dL (ref 0.0–40.0)

## 2021-09-12 MED ORDER — SPIRONOLACTONE 25 MG PO TABS: 25.0000 mg | ORAL_TABLET | Freq: Every day | ORAL | 3 refills | Status: DC

## 2021-09-12 MED ORDER — VALSARTAN-HYDROCHLOROTHIAZIDE 80-12.5 MG PO TABS: 1.0000 | ORAL_TABLET | Freq: Every day | ORAL | 3 refills | Status: DC

## 2021-09-12 NOTE — Patient Instructions (Addendum)
Health Maintenance Due  Topic Date Due   COVID-19 Vaccine (3 - Moderna risk series) -Recommended getting Omicron/Bivalent specific booster only at your local pharmacy! Please let us know when you have received this vaccination.  02/11/2020   INFLUENZA VACCINE  - Team is calling pharmacy for date of Flu shot 07/10/2021   Please stop by lab before you go If you have mychart- we will send your results within 3 business days of [place] receiving them.  If you do not have mychart- we will call you about results within 5 business days of [place] receiving them.  *please also note that you will see labs on mychart as soon as they post. I will later go in and write notes on them- will say "notes from Dr. Yong Channel"  Please have Gynecology send [place] a copy of your DEXA Scan after they have completed this.  Talk to your husband about Prevnar 20 - you just had Pneumovax 23 last year and we want to see if this will be a good option for you to receive.  Recommended follow up: Return in about 1 year (around 09/12/2022) for a follow-up or sooner if needed.

## 2021-09-12 NOTE — Progress Notes (Signed)
Phone [phone] In person visit   Subjective:   Ruth Shields is a 75 y.o. year old very pleasant female patient who presents for/with See problem oriented charting Chief Complaint  Patient presents with   Hypertension   Gastroesophageal Reflux    This visit occurred during the SARS-CoV-2 public health emergency.  Safety protocols were in place, including screening questions prior to the visit, additional usage of staff PPE, and extensive cleaning of exam room while observing appropriate contact time as indicated for disinfecting solutions.   Past Medical History-  Patient Active Problem List   Diagnosis Date Noted   Nonobstructive CAD per cath 2018-Dr. Burt Knack 03/19/2019    Priority: 1.   Aneurysm of ascending aorta 05/23/2017    Priority: 1.   Hyperlipidemia 11/13/2017    Priority: 2.   Osteoporosis 11/12/2016    Priority: 2.   Hormone replacement therapy 11/12/2016    Priority: 2.   Essential hypertension 03/21/2010    Priority: 2.   GERD (gastroesophageal reflux disease) 11/12/2016    Priority: 3.   Palpitations 07/31/2014    Priority: 3.   MURMUR 03/21/2010    Priority: 3.   Abnormal stress echo 05/28/2017    Medications- reviewed and updated Current Outpatient Medications  Medication Sig Dispense Refill   calcium carbonate (OS-CAL) 1250 (500 Ca) MG chewable tablet Chew 1 tablet by mouth daily.     diclofenac (VOLTAREN) 75 MG EC tablet Take 75 mg by mouth 2 (two) times daily as needed (headaches).      estradiol (VIVELLE-DOT) 0.0375 MG/24HR Place 1 patch onto the skin 2 (two) times a week.     Multiple Vitamins-Minerals (MULTIVITAMIN WITH MINERALS) tablet Take 1 tablet by mouth daily.     nitroGLYCERIN (NITROSTAT) 0.4 MG SL tablet Place 1 tablet (0.4 mg total) under the tongue every 5 (five) minutes as needed for chest pain. 25 tablet 3   progesterone (PROMETRIUM) 100 MG capsule Take 100 mg by mouth every evening.      rosuvastatin (CRESTOR) 5 MG tablet Take 1  tablet (5 mg total) by mouth daily. 90 tablet 3   senna (SENOKOT) 8.6 MG tablet Take 1 tablet by mouth daily.     spironolactone (ALDACTONE) 25 MG tablet TAKE 1 TABLET DAILY 90 tablet 0   valsartan-hydrochlorothiazide (DIOVAN-HCT) 80-12.5 MG tablet TAKE 1 TABLET DAILY 90 tablet 0   Vitamin D, Ergocalciferol, (DRISDOL) 50000 UNITS CAPS capsule Take 50,000 Units by mouth every 7 (seven) days.     No current facility-administered medications for this visit.     Objective:  BP 108/69   Pulse 75   Temp 98 F (36.7 C) (Temporal)   Ht 5\' 5"  (1.651 m)   Wt 144 lb (65.3 kg)   SpO2 97%   BMI 23.96 kg/m  Gen: NAD, resting comfortably CV: RRR no murmurs rubs or gallops Lungs: CTAB no crackles, wheeze, rhonchi Abdomen: soft/nontender/nondistended/normal bowel sounds. No rebound or guarding.  Ext: no edema Skin: warm, dry     Assessment and Plan   #social update- mom at [facility] assisted living for a year now- things finally settling down. She is 50!  - son moved to [place] from [place].   # mammogram q6 months planned due to mild abnormality- may go to annual   #hypertension S: medication: spironolactone 25 mg daily, valsartan- hydrochlorothiazide 80-12.5 mg daily Home readings #s: Sparing checks BP Readings from Last 3 Encounters:  09/12/21 108/69  11/07/20 116/70  05/19/20 118/60  A/P:  Controlled. Continue current medications.    #Coronary Artery Disease- nonobstructive primarily affecting the LAD based on catheterization with Dr. Burt Knack 2018. S: Medication: rosuvastatin 5 mg -2 days a week  (was getting muscle aches on 4 days a week- she reflects and was not taking even twice weekly consistently last year). With Aspirin 81 mg twice weekly- had too much bruising though so stopped.  . No nitroglycerin use.  Lab Results  Component Value Date   CHOL 201 (H) 05/19/2020   HDL 66.80 05/19/2020   LDLCALC 121 (H) 05/19/2020   TRIG 65.0 05/19/2020   CHOLHDL 3 05/19/2020    A/P: CAD asymptomatic.  Hyperlipidemia with mild poor control last year-update lipid panel and reassess-had encouraged daily use of rosuvastatin up from 4 days a week last year (see above but was not even taking twice a week and now taking twice consistently). Will try to gradually adjust dose and hopefully still avoid mylagias.   - no aspirin due to bruising  # GERD S:Medication: Pepcid sparingly A/P: reasonable control with sparing use- continue to monitor    # Osteoporosis S: Medication: Vitamin D 50k units once a week in the past but vitamin D levels were high and we planned to hold for a time. She was concerned about lab error along with husband- maintained dose and luckily levels were normal in [place] at [place] on 11/11/20 at 56 ng/ML. Taking calcium supplement.  Medication: fosamax in the past over 20 years ago Last vitamin D Lab Results  Component Value Date   VD25OH 113.04 (HH) 05/19/2020  A/P: Worst T score on last bone density in 2019 was only -1.2-it sounds like she had a significant response to weightbearing exercise as well as Fosamax in the past but numbers have been much better overall-she plans to get this done with gynecology and will have them send [place] a copy (we can order if not but ideally not switch machines) -as long as D not high I would be willing to refill the 50k units  # Ascending Aortic Aneurysm-follows with Dr. Burt Knack S: Incidental finding during chest pain work-up May 21 2017-4.3 cm.  Follows with Dr. Burt Knack.  Last imaging October 18, 2020 at 4.3 cm essentially unchanged from your prior with plan for annual follow-up-she is already scheduled to see Dr. Burt Knack within the next few months A/P: stable- continue close follow up with Dr. Burt Knack- already scheduled for November scan  -Continue to maintain systolic blood pressure goal 1.5-1 20 Recommended follow up: No follow-ups on file. Future Appointments  Date Time Provider [place]  10/18/2021  7:45 AM  [facility]  10/24/2021 12:00 PM [facility]-MR 1 [facility]  12/12/2021 11:00 AM [facility] DIAG [facility]OM[facility] 1 [facility]  01/01/2022  2:40 PM Sherren Mocha, MD [facility]     Lab/Order associations: NOT fasting   ICD-10-CM   1. Essential hypertension  I10     2. Coronary artery disease involving native coronary artery of native heart without angina pectoris  I25.10     3. Gastroesophageal reflux disease without esophagitis  K21.9     4. Age-related osteoporosis without current pathological fracture  M81.0     5. Ascending aortic aneurysm, unspecified whether ruptured  I71.21     6. Hyperlipidemia, unspecified hyperlipidemia type  E78.5      No orders of the defined types were placed in this encounter.  I,Harris Phan,acting as a Education administrator for Garret Reddish, MD.,have documented all relevant documentation on the behalf  of Garret Reddish, MD,as directed by  Garret Reddish, MD while in the presence of Garret Reddish, MD.  I, Garret Reddish, MD, have reviewed all documentation for this visit. The documentation on 09/12/21 for the exam, diagnosis, procedures, and orders are all accurate and complete.  Return precautions advised.  Garret Reddish, MD

## 2021-10-18 ENCOUNTER — Other Ambulatory Visit: Payer: Medicare Other

## 2021-10-18 ENCOUNTER — Other Ambulatory Visit: Payer: Self-pay

## 2021-10-18 DIAGNOSIS — Z01812 Encounter for preprocedural laboratory examination: Secondary | ICD-10-CM

## 2021-10-18 LAB — BASIC METABOLIC PANEL
BUN/Creatinine Ratio: 24 (ref 12–28)
BUN: 19 mg/dL (ref 8–27)
CO2: 26 mmol/L (ref 20–29)
Calcium: 9.6 mg/dL (ref 8.7–10.3)
Chloride: 99 mmol/L (ref 96–106)
Creatinine, Ser: 0.79 mg/dL (ref 0.57–1.00)
Glucose: 96 mg/dL (ref 70–99)
Potassium: 3.9 mmol/L (ref 3.5–5.2)
Sodium: 137 mmol/L (ref 134–144)
eGFR: 79 mL/min/{1.73_m2} (ref >59)

## 2021-10-24 ENCOUNTER — Other Ambulatory Visit: Payer: Self-pay

## 2021-10-24 ENCOUNTER — Ambulatory Visit (HOSPITAL_COMMUNITY)
Admission: RE | Admit: 2021-10-24 | Discharge: 2021-10-24 | Disposition: A | Discharge status: Home / Self Care | Payer: Medicare Other | Source: Ambulatory Visit | Attending: Cardiovascular Disease | Admitting: Cardiovascular Disease

## 2021-10-24 DIAGNOSIS — I712 Thoracic aortic aneurysm, without rupture, unspecified: Secondary | ICD-10-CM | POA: Insufficient documentation

## 2021-10-24 MED ORDER — GADOBUTROL 1 MMOL/ML IV SOLN
7.0000 mL | Freq: Once | INTRAVENOUS | Status: AC | PRN
  Administered 2021-10-24: 7 mL via INTRAVENOUS

## 2021-11-07 ENCOUNTER — Ambulatory Visit: Payer: Medicare Other | Admitting: Cardiovascular Disease

## 2021-12-12 ENCOUNTER — Other Ambulatory Visit: Payer: Self-pay

## 2021-12-12 ENCOUNTER — Ambulatory Visit
Admission: RE | Admit: 2021-12-12 | Discharge: 2021-12-12 | Disposition: A | Discharge status: Home / Self Care | Payer: Medicare Other | Source: Ambulatory Visit | Attending: Obstetrics and Gynecology | Admitting: Obstetrics and Gynecology

## 2021-12-12 DIAGNOSIS — R921 Mammographic calcification found on diagnostic imaging of breast: Secondary | ICD-10-CM

## 2021-12-14 ENCOUNTER — Ambulatory Visit (INDEPENDENT_AMBULATORY_CARE_PROVIDER_SITE_OTHER): Payer: Medicare Other

## 2021-12-14 ENCOUNTER — Other Ambulatory Visit: Payer: Self-pay

## 2021-12-14 DIAGNOSIS — Z0000 Encounter for general adult medical examination without abnormal findings: Secondary | ICD-10-CM | POA: Diagnosis not present

## 2021-12-14 NOTE — Patient Instructions (Addendum)
Ruth Shields , Thank you for taking time to come for your Medicare Wellness Visit. I appreciate your ongoing commitment to your health goals. Please review the following plan we discussed and let me know if I can assist you in the future.   Screening recommendations/referrals: Colonoscopy: Done 05/10/17 repeat every 5 years  Mammogram: Done 06/08/21 repeat every year  Bone Density: Done 02/13/18 repeat every 2 years  Recommended yearly ophthalmology/optometry visit for glaucoma screening and checkup Recommended yearly dental visit for hygiene and checkup  Vaccinations: Influenza vaccine: Done 08/17/21 repeat every year  Pneumococcal vaccine: Up to date Tdap vaccine: Completed 04/14/19  Shingles vaccine: Completed 02/21/18 & 09/03/17   Covid-19:Completed 1/7, 2/4, 10/07/20 & 07/10/21  Advanced directives: Please bring a copy of your health care power of attorney and living will to the office at your convenience.  Conditions/risks identified: Stay fit and don't gain weight   Next appointment: Follow up in one year for your annual wellness visit    Preventive Care 65 Years and Older, Female Preventive care refers to lifestyle choices and visits with your health care provider that can promote health and wellness. What does preventive care include? A yearly physical exam. This is also called an annual well check. Dental exams once or twice a year. Routine eye exams. Ask your health care provider how often you should have your eyes checked. Personal lifestyle choices, including: Daily care of your teeth and gums. Regular physical activity. Eating a healthy diet. Avoiding tobacco and drug use. Limiting alcohol use. Practicing safe sex. Taking low-dose aspirin every day. Taking vitamin and mineral supplements as recommended by your health care provider. What happens during an annual well check? The services and screenings done by your health care provider during your annual well check will depend on  your age, overall health, lifestyle risk factors, and family history of disease. Counseling  Your health care provider may ask you questions about your: Alcohol use. Tobacco use. Drug use. Emotional well-being. Home and relationship well-being. Sexual activity. Eating habits. History of falls. Memory and ability to understand (cognition). Work and work Statistician. Reproductive health. Screening  You may have the following tests or measurements: Height, weight, and BMI. Blood pressure. Lipid and cholesterol levels. These may be checked every 5 years, or more frequently if you are over 74 years old. Skin check. Lung cancer screening. You may have this screening every year starting at age 74 if you have a 30-pack-year history of smoking and currently smoke or have quit within the past 15 years. Fecal occult blood test (FOBT) of the stool. You may have this test every year starting at age 74. Flexible sigmoidoscopy or colonoscopy. You may have a sigmoidoscopy every 5 years or a colonoscopy every 10 years starting at age 74. Hepatitis C blood test. Hepatitis B blood test. Sexually transmitted disease (STD) testing. Diabetes screening. This is done by checking your blood sugar (glucose) after you have not eaten for a while (fasting). You may have this done every 1-3 years. Bone density scan. This is done to screen for osteoporosis. You may have this done starting at age 74. Mammogram. This may be done every 1-2 years. Talk to your health care provider about how often you should have regular mammograms. Talk with your health care provider about your test results, treatment options, and if necessary, the need for more tests. Vaccines  Your health care provider may recommend certain vaccines, such as: Influenza vaccine. This is recommended every year. Tetanus, diphtheria,  and acellular pertussis (Tdap, Td) vaccine. You may need a Td booster every 10 years. Zoster vaccine. You may need this  after age 74. Pneumococcal 13-valent conjugate (PCV13) vaccine. One dose is recommended after age 74. Pneumococcal polysaccharide (PPSV23) vaccine. One dose is recommended after age 74. Talk to your health care provider about which screenings and vaccines you need and how often you need them. This information is not intended to replace advice given to you by your health care provider. Make sure you discuss any questions you have with your health care provider. Document Released: 12/23/2015 Document Revised: 08/15/2016 Document Reviewed: 09/27/2015 Elsevier Interactive Patient Education  2017 [employer] Prevention in the Home Falls can cause injuries. They can happen to people of all ages. There are many things you can do to make your home safe and to help prevent falls. What can I do on the outside of my home? Regularly fix the edges of walkways and driveways and fix any cracks. Remove anything that might make you trip as you walk through a door, such as a raised step or threshold. Trim any bushes or trees on the path to your home. Use bright outdoor lighting. Clear any walking paths of anything that might make someone trip, such as rocks or tools. Regularly check to see if handrails are loose or broken. Make sure that both sides of any steps have handrails. Any raised decks and porches should have guardrails on the edges. Have any leaves, snow, or ice cleared regularly. Use sand or salt on walking paths during winter. Clean up any spills in your garage right away. This includes oil or grease spills. What can I do in the bathroom? Use night lights. Install grab bars by the toilet and in the tub and shower. Do not use towel bars as grab bars. Use non-skid mats or decals in the tub or shower. If you need to sit down in the shower, use a plastic, non-slip stool. Keep the floor dry. Clean up any water that spills on the floor as soon as it happens. Remove soap buildup in the tub or  shower regularly. Attach bath mats securely with double-sided non-slip rug tape. Do not have throw rugs and other things on the floor that can make you trip. What can I do in the bedroom? Use night lights. Make sure that you have a light by your bed that is easy to reach. Do not use any sheets or blankets that are too big for your bed. They should not hang down onto the floor. Have a firm chair that has side arms. You can use this for support while you get dressed. Do not have throw rugs and other things on the floor that can make you trip. What can I do in the kitchen? Clean up any spills right away. Avoid walking on wet floors. Keep items that you use a lot in easy-to-reach places. If you need to reach something above you, use a strong step stool that has a grab bar. Keep electrical cords out of the way. Do not use floor polish or wax that makes floors slippery. If you must use wax, use non-skid floor wax. Do not have throw rugs and other things on the floor that can make you trip. What can I do with my stairs? Do not leave any items on the stairs. Make sure that there are handrails on both sides of the stairs and use them. Fix handrails that are broken or loose. Make sure  that handrails are as long as the stairways. Check any carpeting to make sure that it is firmly attached to the stairs. Fix any carpet that is loose or worn. Avoid having throw rugs at the top or bottom of the stairs. If you do have throw rugs, attach them to the floor with carpet tape. Make sure that you have a light switch at the top of the stairs and the bottom of the stairs. If you do not have them, ask someone to add them for you. What else can I do to help prevent falls? Wear shoes that: Do not have high heels. Have rubber bottoms. Are comfortable and fit you well. Are closed at the toe. Do not wear sandals. If you use a stepladder: Make sure that it is fully opened. Do not climb a closed stepladder. Make  sure that both sides of the stepladder are locked into place. Ask someone to hold it for you, if possible. Clearly mark and make sure that you can see: Any grab bars or handrails. First and last steps. Where the edge of each step is. Use tools that help you move around (mobility aids) if they are needed. These include: Canes. Walkers. Scooters. Crutches. Turn on the lights when you go into a dark area. Replace any light bulbs as soon as they burn out. Set up your furniture so you have a clear path. Avoid moving your furniture around. If any of your floors are uneven, fix them. If there are any pets around you, be aware of where they are. Review your medicines with your doctor. Some medicines can make you feel dizzy. This can increase your chance of falling. Ask your doctor what other things that you can do to help prevent falls. This information is not intended to replace advice given to you by your health care provider. Make sure you discuss any questions you have with your health care provider. Document Released: 09/22/2009 Document Revised: 05/03/2016 Document Reviewed: 12/31/2014 Elsevier Interactive Patient Education  2017 [employer].

## 2021-12-14 NOTE — Progress Notes (Signed)
Virtual Visit via Telephone Note  I connected with  Ruth Shields on 12/14/21 at  9:30 AM EST by telephone and verified that I am speaking with the correct person using two identifiers.  Medicare Annual Wellness visit completed telephonically due to Covid-19 pandemic.   Persons participating in this call: This Health Coach and this patient.   Location: Patient: Home Provider: [facility]   I discussed the limitations, risks, security and privacy concerns of performing an evaluation and management service by telephone and the availability of in person appointments. The patient expressed understanding and agreed to proceed.  Unable to perform video visit due to video visit attempted and failed and/or patient does not have video capability.   Some vital signs may be absent or patient reported.   Willette Brace, LPN   Subjective:   Ruth Shields is a 76 y.o. female who presents for Medicare Annual (Subsequent) preventive examination.  Review of Systems     Cardiac Risk Factors include: advanced age (>68men, >40 women);hypertension;dyslipidemia     Objective:    There were no vitals filed for this visit. There is no height or weight on file to calculate BMI.  Advanced Directives 12/14/2021 05/28/2017 05/10/2017 04/24/2017  Does Patient Have a Medical Advance Directive? Yes Yes Yes Yes  Type of Paramedic of [place];Living will - [place];Living will  Does patient want to make changes to medical advance directive? - No - Patient declined - -  Copy of [place] in Chart? No - copy requested No - copy requested - -    Current Medications (verified) Outpatient Encounter Medications as of 12/14/2021  Medication Sig   calcium carbonate (OS-CAL) 1250 (500 Ca) MG chewable tablet Chew 1 tablet by mouth daily.   diclofenac (VOLTAREN) 75 MG EC tablet Take 75 mg by mouth 2 (two) times daily as needed  (headaches).    estradiol (VIVELLE-DOT) 0.0375 MG/24HR Place 1 patch onto the skin 2 (two) times a week.   Multiple Vitamins-Minerals (MULTIVITAMIN WITH MINERALS) tablet Take 1 tablet by mouth daily.   nitroGLYCERIN (NITROSTAT) 0.4 MG SL tablet Place 1 tablet (0.4 mg total) under the tongue every 5 (five) minutes as needed for chest pain.   progesterone (PROMETRIUM) 100 MG capsule Take 100 mg by mouth every evening.    rosuvastatin (CRESTOR) 5 MG tablet Take 1 tablet (5 mg total) by mouth daily.   senna (SENOKOT) 8.6 MG tablet Take 1 tablet by mouth daily.   spironolactone (ALDACTONE) 25 MG tablet Take 1 tablet (25 mg total) by mouth daily.   valsartan-hydrochlorothiazide (DIOVAN-HCT) 80-12.5 MG tablet Take 1 tablet by mouth daily.   Vitamin D, Ergocalciferol, (DRISDOL) 50000 UNITS CAPS capsule Take 50,000 Units by mouth every 7 (seven) days. (Patient not taking: Reported on 12/14/2021)   No facility-administered encounter medications on file as of 12/14/2021.    Allergies (verified) Patient has no known allergies.   History: Past Medical History:  Diagnosis Date   Cancer ([place])    Hypertension    Premature ventricular contractions (PVCs) (VPCs)    Past Surgical History:  Procedure Laterality Date   BREAST BIOPSY     x2- benign   BREAST EXCISIONAL BIOPSY Right    20 years ago x 2   COLONOSCOPY     COLONOSCOPY W/ BIOPSIES AND POLYPECTOMY  2004   Dr. Cristal Ford, [place]     left   KNEE  ARTHROSCOPY     left and right- torn meniscus- [facility]   LEFT HEART CATH AND CORONARY ANGIOGRAPHY N/A 05/28/2017   Procedure: Left Heart Cath and Coronary Angiography;  Surgeon: Sherren Mocha, MD;  Location: [facility];  Service: Cardiovascular;  Laterality: N/A;   POLYPECTOMY     SKIN CANCER EXCISION     Dr. Delman Cheadle   Family History  Problem Relation Age of Onset   Atrial fibrillation Mother        81 and independent   Cancer Father        lymphoma    Hypertension Father    Parkinson's disease Father        65   Healthy Brother    Other Brother        died in [place]   Colon cancer Neg Hx    Stomach cancer Neg Hx    Rectal cancer Neg Hx    Esophageal cancer Neg Hx    Social History   Socioeconomic History   Marital status: Married    Spouse name: Not on file   Number of children: Not on file   Years of education: Not on file   Highest education level: Not on file  Occupational History   Not on file  Tobacco Use   Smoking status: Never   Smokeless tobacco: Never  Substance and Sexual Activity   Alcohol use: Yes    Alcohol/week: 7.0 standard drinks    Types: 7 Glasses of wine per week   Drug use: No   Sexual activity: Not on file  Other Topics Concern   Not on file  Social History Narrative   Married. 2 children (41 son with 22 sons, 58 year old married- horse and dogs) in 2017.    Lives in [place].       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      Hobbies: riding bikes, ski, yoga, workout at [employer], reading, time with mom ([place]- 3 [place] drive)   Social Determinants of Health   Financial Resource Strain: Low Risk    Difficulty of Paying Living Expenses: Not hard at all  Food Insecurity: No Food Insecurity   Worried About Charity fundraiser in the Last Year: Never true   Arboriculturist in the Last Year: Never true  Transportation Needs: No Transportation Needs   Lack of Transportation (Medical): No   Lack of Transportation (Non-Medical): No  Physical Activity: Sufficiently Active   Days of Exercise per Week: 4 days   Minutes of Exercise per Session: 60 min  Stress: No Stress Concern Present   Feeling of Stress : Not at all  Social Connections: Moderately Integrated   Frequency of Communication with Friends and Family: More than three times a week   Frequency of Social Gatherings with Friends and  Family: More than three times a week   Attends Religious Services: More than 4 times per year   Active Member of [employer] or Organizations: No   Attends Archivist Meetings: Never   Marital Status: Married    Tobacco Counseling Counseling given: Not Answered   Clinical Intake:  Pre-visit preparation completed: Yes  Pain : No/denies pain     BMI - recorded: 23.96 Nutritional Status: BMI of 19-24  Normal Nutritional Risks: None Diabetes: No  How often do you need to  have someone help you when you read instructions, pamphlets, or other written materials from your doctor or pharmacy?: 1 - Never  Diabetic?No  Interpreter Needed?: No  Information entered by :: Charlott Rakes, LPN   Activities of Daily Living In your present state of health, do you have any difficulty performing the following activities: 12/14/2021 09/12/2021  Hearing? N N  Vision? N N  Difficulty concentrating or making decisions? N N  Walking or climbing stairs? N N  Dressing or bathing? N N  Doing errands, shopping? N N  Preparing Food and eating ? N -  Using the Toilet? N -  In the past six months, have you accidently leaked urine? N -  Do you have problems with loss of bowel control? N -  Managing your Medications? N -  Managing your Finances? N -  Housekeeping or managing your Housekeeping? N -  Some recent data might be hidden    Patient Care Team: Marin Olp, MD as PCP - General (Family Medicine) Sherren Mocha, MD as PCP - Cardiology (Cardiology) Dian Queen, MD as Consulting Physician (Obstetrics and Gynecology) Sharyne Peach, MD as Consulting Physician (Ophthalmology) Ladene Artist, MD as Consulting Physician (Gastroenterology)  Indicate any recent Medical Services you may have received from other than [facility] providers in the past year (date may be approximate).     Assessment:   This is a routine wellness examination for Maleiah.  Hearing/Vision screen Hearing  Screening - Comments:: Pt denies any hearing issues  Vision Screening - Comments:: Pt follows up with [facility] opthalmology for annual eye exams   Dietary issues and exercise activities discussed: Current Exercise Habits: Home exercise routine;Structured exercise class, Type of exercise: walking;Other - see comments (cardiac), Time (Minutes): 60, Frequency (Times/Week): 4, Weekly Exercise (Minutes/Week): 240   Goals Addressed             This Visit's Progress    Patient Stated       Stay fit and don't gain weight        Depression Screen PHQ 2/9 Scores 12/14/2021 09/12/2021 05/19/2020 03/19/2019 11/13/2017 11/12/2016  PHQ - 2 Score 0 0 0 0 0 0    Fall Risk Fall Risk  12/14/2021 09/12/2021 05/19/2020 10/30/2018 11/13/2017  Falls in the past year? 0 0 0 0 No  Comment - - - Emmi Telephone Survey: data to providers prior to load -  Number falls in past yr: 0 0 0 - -  Injury with Fall? 0 0 0 - -  Risk for fall due to : - No Fall Risks - - -  Follow up - Falls evaluation completed - - -    FALL RISK PREVENTION PERTAINING TO THE HOME:  Any stairs in or around the home? Yes  If so, are there any without handrails? No  Home free of loose throw rugs in walkways, pet beds, electrical cords, etc? Yes  Adequate lighting in your home to reduce risk of falls? Yes   ASSISTIVE DEVICES UTILIZED TO PREVENT FALLS:  Life alert? No  Use of a cane, walker or w/c? No  Grab bars in the bathroom? Yes  Shower chair or bench in shower? No  Elevated toilet seat or a handicapped toilet? No   TIMED UP AND GO:  Was the test performed? No .   Cognitive Function:     6CIT Screen 12/14/2021  What Year? 0 points  What month? 0 points  What time? 0 points  Count back from 20 0  points  Months in reverse 0 points  Repeat phrase 0 points  Total Score 0    Immunizations Immunization History  Administered Date(s) Administered   Fluad Quad(high Dose 65+) 08/17/2021   Influenza-Unspecified 08/10/2014,  09/08/2014, 10/04/2015, 09/26/2016, 09/25/2017, 09/28/2019   Moderna SARS-COV2 Booster Vaccination 10/07/2020, 07/10/2021   Moderna Sars-Covid-2 Vaccination 12/17/2019, 01/14/2020   Pneumococcal Conjugate-13 04/28/2014   Pneumococcal Polysaccharide-23 05/19/2020   Td 12/10/2008, 03/24/2019   Tdap 04/14/2019   Zoster Recombinat (Shingrix) 09/03/2017, 02/21/2018    TDAP status: Up to date  Flu Vaccine status: Up to date  Pneumococcal vaccine status: Up to date  Covid-19 vaccine status: Completed vaccines  Qualifies for Shingles Vaccine? Yes   Zostavax completed Yes   Shingrix Completed?: Yes  Screening Tests Health Maintenance  Topic Date Due   COVID-19 Vaccine (3 - Moderna risk series) 08/07/2021   COLONOSCOPY (Pts 45-67yrs Insurance coverage will need to be confirmed)  05/10/2022   MAMMOGRAM  12/13/2023   TETANUS/TDAP  04/13/2029   Pneumonia Vaccine 62+ Years old  Completed   INFLUENZA VACCINE  Completed   DEXA SCAN  Completed   Zoster Vaccines- Shingrix  Completed   Hepatitis C Screening  Addressed   HPV VACCINES  Aged Out    Health Maintenance  Health Maintenance Due  Topic Date Due   COVID-19 Vaccine (3 - Moderna risk series) 08/07/2021    Colorectal cancer screening: Type of screening: Colonoscopy. Completed 05/10/17. Repeat every 5 years  Mammogram status: Completed 12/12/21. Repeat every year  Bone Density status: Completed 02/13/18. Results reflect: Bone density results: OSTEOPOROSIS. Repeat every 2 years.    Additional Screening:  Hepatitis C Screening: Completed 11/12/16  Vision Screening: Recommended annual ophthalmology exams for early detection of glaucoma and other disorders of the eye. Is the patient up to date with their annual eye exam?  Yes  Who is the provider or what is the name of the office in which the patient attends annual eye exams? [facility] opthalmology  If pt is not established with a provider, would they like to be referred to a  provider to establish care? No .   Dental Screening: Recommended annual dental exams for proper oral hygiene  Community Resource Referral / Chronic Care Management: CRR required this visit?  No   CCM required this visit?  No      Plan:     I have personally reviewed and noted the following in the patients chart:   Medical and social history Use of alcohol, tobacco or illicit drugs  Current medications and supplements including opioid prescriptions.  Functional ability and status Nutritional status Physical activity Advanced directives List of other physicians Hospitalizations, surgeries, and ER visits in previous 12 months Vitals Screenings to include cognitive, depression, and falls Referrals and appointments  In addition, I have reviewed and discussed with patient certain preventive protocols, quality metrics, and best practice recommendations. A written personalized care plan for preventive services as well as general preventive health recommendations were provided to patient.     Willette Brace, LPN   [id number]   Nurse Notes: None

## 2021-12-26 ENCOUNTER — Ambulatory Visit: Payer: Medicare Other | Admitting: Cardiovascular Disease

## 2022-01-01 ENCOUNTER — Ambulatory Visit (INDEPENDENT_AMBULATORY_CARE_PROVIDER_SITE_OTHER): Payer: Medicare Other | Admitting: Cardiovascular Disease

## 2022-01-01 ENCOUNTER — Other Ambulatory Visit: Payer: Self-pay

## 2022-01-01 ENCOUNTER — Encounter: Payer: Self-pay | Admitting: Cardiovascular Disease

## 2022-01-01 VITALS — BP 106/68 | HR 72 | Ht 65.0 in | Wt 152.0 lb

## 2022-01-01 DIAGNOSIS — E782 Mixed hyperlipidemia: Secondary | ICD-10-CM

## 2022-01-01 DIAGNOSIS — E785 Hyperlipidemia, unspecified: Secondary | ICD-10-CM | POA: Diagnosis not present

## 2022-01-01 DIAGNOSIS — R002 Palpitations: Secondary | ICD-10-CM | POA: Diagnosis not present

## 2022-01-01 DIAGNOSIS — I10 Essential (primary) hypertension: Secondary | ICD-10-CM

## 2022-01-01 DIAGNOSIS — I7121 Aneurysm of the ascending aorta, without rupture: Secondary | ICD-10-CM | POA: Diagnosis not present

## 2022-01-01 MED ORDER — EZETIMIBE 10 MG PO TABS: 10.0000 mg | ORAL_TABLET | Freq: Every day | ORAL | 3 refills | Status: DC

## 2022-01-01 MED ORDER — NITROGLYCERIN 0.4 MG SL SUBL: 0.4000 mg | SUBLINGUAL_TABLET | SUBLINGUAL | 3 refills | Status: DC | PRN

## 2022-01-01 NOTE — Patient Instructions (Addendum)
Medication Instructions:  START Zetia (Ezetimibe) 10mg  once daily *If you need a refill on your cardiac medications before your next appointment, please call your pharmacy*   Lab Work: Lipids, Liver, VitD in 3 months If you have labs (blood work) drawn today and your tests are completely normal, you will receive your results only by: [facility] (if you have MyChart) OR A paper copy in the mail If you have any lab test that is abnormal or we need to change your treatment, we will call you to review the results.   Testing/Procedures: NONE   Follow-Up: At [facility], you and your health needs are our priority.  As part of our continuing mission to provide you with exceptional heart care, we have created designated Provider Care Teams.  These Care Teams include your primary Cardiologist (physician) and Advanced Practice Providers (APPs -  Physician Assistants and Nurse Practitioners) who all work together to provide you with the care you need, when you need it.   Your next appointment:   1 year(s)  The format for your next appointment:   In Person  Provider:   Sherren Mocha, MD

## 2022-01-01 NOTE — Progress Notes (Signed)
Cardiology Office Note:    Date:  01/01/2022   ID:  Ruth, Shields 1946-03-31, MRN [id number]  PCP:  Marin Olp, MD   [facility] Providers Cardiologist:  Sherren Mocha, MD     Referring MD: Marin Olp, MD   Chief Complaint  Patient presents with   Hyperlipidemia    History of Present Illness:    Ruth Shields is a 76 y.o. female with a hx of mild nonobstructive coronary artery disease and ascending aortic aneurysm, HTN, hyperlipidemia, presenting for follow-up evaluation. She is here alone today. She's been doing well. She has taken a holiday from [place] because of leg fatigue and discomfort. Her symptoms have markedly improved off of [place]. No other current complaints. She is exercising regularly with no chest pain, chest pressure, or shortness of breath.   Past Medical History:  Diagnosis Date   Cancer ([place])    Hypertension    Premature ventricular contractions (PVCs) (VPCs)     Past Surgical History:  Procedure Laterality Date   BREAST BIOPSY     x2- benign   BREAST EXCISIONAL BIOPSY Right    20 years ago x 2   COLONOSCOPY     COLONOSCOPY W/ BIOPSIES AND POLYPECTOMY  2004   Dr. Cristal Ford, [place]     left   KNEE ARTHROSCOPY     left and right- torn meniscus- [facility]   LEFT HEART CATH AND CORONARY ANGIOGRAPHY N/A 05/28/2017   Procedure: Left Heart Cath and Coronary Angiography;  Surgeon: Sherren Mocha, MD;  Location: [facility];  Service: Cardiovascular;  Laterality: N/A;   POLYPECTOMY     SKIN CANCER EXCISION     Dr. Delman Cheadle    Current Medications: Current Meds  Medication Sig   calcium carbonate (OS-CAL) 1250 (500 Ca) MG chewable tablet Chew 1 tablet by mouth daily.   diclofenac (VOLTAREN) 75 MG EC tablet Take 75 mg by mouth 2 (two) times daily as needed (headaches).    estradiol (VIVELLE-DOT) 0.0375 MG/24HR Place 1 patch onto the skin 2 (two) times a week.   ezetimibe (ZETIA) 10 MG tablet Take 1  tablet (10 mg total) by mouth daily.   Multiple Vitamins-Minerals (MULTIVITAMIN WITH MINERALS) tablet Take 1 tablet by mouth daily.   Multiple Vitamins-Minerals (PRESERVISION AREDS) TABS Take by mouth daily. Per patient taking 2 tablets daily   progesterone (PROMETRIUM) 100 MG capsule Take 100 mg by mouth every evening.    rosuvastatin (CRESTOR) 5 MG tablet Take 1 tablet (5 mg total) by mouth daily.   senna (SENOKOT) 8.6 MG tablet Take 1 tablet by mouth daily.   spironolactone (ALDACTONE) 25 MG tablet Take 1 tablet (25 mg total) by mouth daily.   valsartan-hydrochlorothiazide (DIOVAN-HCT) 80-12.5 MG tablet Take 1 tablet by mouth daily.   Vitamin D, Ergocalciferol, (DRISDOL) 50000 UNITS CAPS capsule Take 50,000 Units by mouth every 7 (seven) days.   [DISCONTINUED] nitroGLYCERIN (NITROSTAT) 0.4 MG SL tablet Place 1 tablet (0.4 mg total) under the tongue every 5 (five) minutes as needed for chest pain.     Allergies:   Patient has no known allergies.   Social History   Socioeconomic History   Marital status: Married    Spouse name: Not on file   Number of children: Not on file   Years of education: Not on file   Highest education level: Not on file  Occupational History   Not on file  Tobacco Use  Smoking status: Never   Smokeless tobacco: Never  Substance and Sexual Activity   Alcohol use: Yes    Alcohol/week: 7.0 standard drinks    Types: 7 Glasses of wine per week   Drug use: No   Sexual activity: Not on file  Other Topics Concern   Not on file  Social History Narrative   Married. 2 children (1 son with 20 sons, 83 year old married- horse and dogs) in 2017.    Lives in [place].       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      Hobbies: riding bikes, ski, yoga, workout at [employer], reading, time with mom ([place]- 3 hour drive)   Social Determinants of Health    Financial Resource Strain: Low Risk    Difficulty of Paying Living Expenses: Not hard at all  Food Insecurity: No Food Insecurity   Worried About Charity fundraiser in the Last Year: Never true   Arboriculturist in the Last Year: Never true  Transportation Needs: No Transportation Needs   Lack of Transportation (Medical): No   Lack of Transportation (Non-Medical): No  Physical Activity: Sufficiently Active   Days of Exercise per Week: 4 days   Minutes of Exercise per Session: 60 min  Stress: No Stress Concern Present   Feeling of Stress : Not at all  Social Connections: Moderately Integrated   Frequency of Communication with Friends and Family: More than three times a week   Frequency of Social Gatherings with Friends and Family: More than three times a week   Attends Religious Services: More than 4 times per year   Active Member of [employer] or Organizations: No   Attends Archivist Meetings: Never   Marital Status: Married     Family History: The patient's family history includes Atrial fibrillation in her mother; Cancer in her father; Healthy in her brother; Hypertension in her father; Other in her brother; Parkinson's disease in her father. There is no history of Colon cancer, Stomach cancer, Rectal cancer, or Esophageal cancer.  ROS:   Please see the history of present illness.    All other systems reviewed and are negative.  EKGs/Labs/Other Studies Reviewed:    The following studies were reviewed today: Cardiac Cath 05/28/2017: 1. Mild nonobstructive CAD, primarily affecting the LAD 2. Angiographically normal RCA, left main, and LCx 3. Normal LVEDP 4. Normal LV systolic function by echo assessment   Suspect noncardiac chest pain. Low-dose statin reasonable consideration for risk reduction if tolerated.   MRA 10/2021: IMPRESSION: VASCULAR   1. Stable fusiform aneurysmal dilation of the ascending thoracic aorta at 4.3 cm. 2. No new acute vascular  abnormality.   EKG:  EKG is ordered today.  The ekg ordered today demonstrates NSR 72 bpm, within normal limits  Recent Labs: 09/12/2021: ALT 12; Hemoglobin 12.2; Platelets 215.0 10/18/2021: BUN 19; Creatinine, Ser 0.79; Potassium 3.9; Sodium 137  Recent Lipid Panel    Component Value Date/Time   CHOL 186 09/12/2021 1403   TRIG 88.0 09/12/2021 1403   HDL 75.50 09/12/2021 1403   CHOLHDL 2 09/12/2021 1403   VLDL 17.6 09/12/2021 1403   LDLCALC 92 09/12/2021 1403     Risk Assessment/Calculations:           Physical Exam:    VS:  BP 106/68    Pulse 72  Ht 5\' 5"  (1.651 m)    Wt 152 lb (68.9 kg)    SpO2 99%    BMI 25.29 kg/m     Wt Readings from Last 3 Encounters:  01/01/22 152 lb (68.9 kg)  09/12/21 144 lb (65.3 kg)  11/07/20 143 lb 6.4 oz (65 kg)     GEN:  Well nourished, well developed in no acute distress HEENT: Normal NECK: No JVD; No carotid bruits LYMPHATICS: No lymphadenopathy CARDIAC: RRR, no murmurs, rubs, gallops RESPIRATORY:  Clear to auscultation without rales, wheezing or rhonchi  ABDOMEN: Soft, non-tender, non-distended MUSCULOSKELETAL:  No edema; No deformity  SKIN: Warm and dry NEUROLOGIC:  Alert and oriented x 3 PSYCHIATRIC:  Normal affect   ASSESSMENT:    1. Mixed hyperlipidemia   2. Palpitations   3. Hyperlipidemia, unspecified hyperlipidemia type   4. Aneurysm of ascending aorta without rupture   5. Essential hypertension    PLAN:    In order of problems listed above:  Statin intolerant. Last LDL 92 mg/dL. Trial of Zetia 10 mg daily and repeat labs in 3 months. Pending those results, will consider PCSK9 if indicated. Discussed this today. Recommend follow-up MRA in 2 year intervals. The patient's recent MRA shows stable findings of a 4.3 cm aorta unchanged from past studies. BP well-controlled.  Continue spironolactone, valsartan, HCTZ. Most recent labs reviewed - creatinine 0.79, K 3.9.      Medication Adjustments/Labs and Tests  Ordered: Current medicines are reviewed at length with the patient today.  Concerns regarding medicines are outlined above.  Orders Placed This Encounter  Procedures   Hepatic function panel   Lipid panel   EKG 12-Lead   Meds ordered this encounter  Medications   nitroGLYCERIN (NITROSTAT) 0.4 MG SL tablet    Sig: Place 1 tablet (0.4 mg total) under the tongue every 5 (five) minutes as needed for chest pain.    Dispense:  25 tablet    Refill:  3   ezetimibe (ZETIA) 10 MG tablet    Sig: Take 1 tablet (10 mg total) by mouth daily.    Dispense:  90 tablet    Refill:  3    Patient Instructions  Medication Instructions:  START Zetia (Ezetimibe) 10mg  once daily *If you need a refill on your cardiac medications before your next appointment, please call your pharmacy*   Lab Work: Lipids, Liver, VitD in 3 months If you have labs (blood work) drawn today and your tests are completely normal, you will receive your results only by: [facility] (if you have MyChart) OR A paper copy in the mail If you have any lab test that is abnormal or we need to change your treatment, we will call you to review the results.   Testing/Procedures: NONE   Follow-Up: At [facility], you and your health needs are our priority.  As part of our continuing mission to provide you with exceptional heart care, we have created designated Provider Care Teams.  These Care Teams include your primary Cardiologist (physician) and Advanced Practice Providers (APPs -  Physician Assistants and Nurse Practitioners) who all work together to provide you with the care you need, when you need it.   Your next appointment:   1 year(s)  The format for your next appointment:   In Person  Provider:   Sherren Mocha, MD       Signed, Sherren Mocha, MD  01/01/2022 3:25 PM    [place]

## 2022-02-26 LAB — HM DEXA SCAN: HM Dexa Scan: NORMAL

## 2022-03-27 ENCOUNTER — Encounter: Payer: Self-pay | Admitting: Cardiovascular Disease

## 2022-03-27 DIAGNOSIS — M810 Age-related osteoporosis without current pathological fracture: Secondary | ICD-10-CM

## 2022-03-27 DIAGNOSIS — E782 Mixed hyperlipidemia: Secondary | ICD-10-CM

## 2022-03-27 DIAGNOSIS — E785 Hyperlipidemia, unspecified: Secondary | ICD-10-CM

## 2022-03-27 NOTE — Telephone Encounter (Signed)
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 ?

## 2022-03-28 NOTE — Telephone Encounter (Signed)
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 ?

## 2022-04-09 ENCOUNTER — Other Ambulatory Visit: Payer: Medicare Other | Admitting: *Deleted

## 2022-04-09 DIAGNOSIS — E782 Mixed hyperlipidemia: Secondary | ICD-10-CM

## 2022-04-09 DIAGNOSIS — E785 Hyperlipidemia, unspecified: Secondary | ICD-10-CM

## 2022-04-09 NOTE — Addendum Note (Signed)
Addended by: Ma Hillock on: 04/09/2022 02:05 PM ? ? Modules accepted: Orders ? ?

## 2022-04-10 LAB — HEPATIC FUNCTION PANEL
ALT: 17 IU/L (ref 0–32)
AST: 28 IU/L (ref 0–40)
Albumin: 4.2 g/dL (ref 3.7–4.7)
Alkaline Phosphatase: 34 IU/L — ABNORMAL LOW (ref 44–121)
Bilirubin Total: 0.4 mg/dL (ref 0.0–1.2)
Bilirubin, Direct: 0.11 mg/dL (ref 0.00–0.40)
Total Protein: 6.2 g/dL (ref 6.0–8.5)

## 2022-04-10 LAB — LIPID PANEL
Chol/HDL Ratio: 2.5 ratio (ref 0.0–4.4)
Cholesterol, Total: 181 mg/dL (ref 100–199)
HDL: 72 mg/dL (ref >39)
LDL Chol Calc (NIH): 97 mg/dL (ref 0–99)
Triglycerides: 62 mg/dL (ref 0–149)
VLDL Cholesterol Cal: 12 mg/dL (ref 5–40)

## 2022-04-10 LAB — LIPOPROTEIN A (LPA): Lipoprotein (a): 16.2 nmol/L (ref <75.0)

## 2022-05-09 ENCOUNTER — Encounter: Payer: Self-pay | Admitting: Gastroenterology

## 2022-05-10 LAB — SPECIMEN STATUS REPORT

## 2022-05-10 LAB — VITAMIN D 25 HYDROXY (VIT D DEFICIENCY, FRACTURES): Vit D, 25-Hydroxy: 78.8 ng/mL (ref 30.0–100.0)

## 2022-05-23 ENCOUNTER — Ambulatory Visit (AMBULATORY_SURGERY_CENTER): Payer: Self-pay | Admitting: *Deleted

## 2022-05-23 VITALS — Ht 65.0 in | Wt 150.2 lb

## 2022-05-23 DIAGNOSIS — Z86010 Personal history of colonic polyps: Secondary | ICD-10-CM

## 2022-05-23 MED ORDER — PEG 3350-KCL-NA BICARB-NACL 420 G PO SOLR: 4000.0000 mL | Freq: Once | ORAL | 0 refills | Status: AC

## 2022-05-23 NOTE — Progress Notes (Signed)
  No trouble with anesthesia, denies being told they were difficult to intubate, or hx/fam hx of malignant hyperthermia per pt   No egg or soy allergy  No home oxygen use   No medications for weight loss taken  emmi information given  Pt has slight constipation issues- longstanding per pt.  She had a good result with Suprep in 2018; Golytely given for this procedure  Pt informed that we do not do prior authorizations for prep

## 2022-06-21 ENCOUNTER — Ambulatory Visit (AMBULATORY_SURGERY_CENTER): Payer: Medicare Other | Admitting: Gastroenterology

## 2022-06-21 ENCOUNTER — Encounter: Payer: Self-pay | Admitting: Gastroenterology

## 2022-06-21 VITALS — BP 119/74 | HR 69 | Temp 97.5°F | Resp 12 | Ht 65.0 in | Wt 150.2 lb

## 2022-06-21 DIAGNOSIS — Z09 Encounter for follow-up examination after completed treatment for conditions other than malignant neoplasm: Secondary | ICD-10-CM | POA: Diagnosis not present

## 2022-06-21 DIAGNOSIS — Z86010 Personal history of colonic polyps: Secondary | ICD-10-CM | POA: Diagnosis not present

## 2022-06-21 DIAGNOSIS — K635 Polyp of colon: Secondary | ICD-10-CM

## 2022-06-21 DIAGNOSIS — Q859 Phakomatosis, unspecified: Secondary | ICD-10-CM | POA: Diagnosis not present

## 2022-06-21 DIAGNOSIS — D125 Benign neoplasm of sigmoid colon: Secondary | ICD-10-CM

## 2022-06-21 MED ORDER — SODIUM CHLORIDE 0.9 % IV SOLN: 500.0000 mL | Freq: Once | INTRAVENOUS | Status: DC

## 2022-06-21 NOTE — Op Note (Addendum)
[facility] Patient Name: Ruth Shields Procedure Date: 06/21/2022 3:40 PM MRN: [id number] Endoscopist: Ladene Artist , MD Age: 76 Referring MD:  Date of Birth: 1945-12-20 Gender: Female Account #: [id number] Procedure:                Colonoscopy Indications:              Surveillance: Personal history of adenomatous                            polyps on last colonoscopy > 5 years ago Medicines:                Monitored Anesthesia Care Procedure:                Pre-Anesthesia Assessment:                           - Prior to the procedure, a History and Physical                            was performed, and patient medications and                            allergies were reviewed. The patient's tolerance of                            previous anesthesia was also reviewed. The risks                            and benefits of the procedure and the sedation                            options and risks were discussed with the patient.                            All questions were answered, and informed consent                            was obtained. Prior Anticoagulants: The patient has                            taken no previous anticoagulant or antiplatelet                            agents. ASA Grade Assessment: III - A patient with                            severe systemic disease. After reviewing the risks                            and benefits, the patient was deemed in                            satisfactory condition to undergo the procedure.  After obtaining informed consent, the colonoscope                            was passed under direct vision. Throughout the                            procedure, the patient's blood pressure, pulse, and                            oxygen saturations were monitored continuously. The                            Olympus [id number]-[id number]) Colonoscope was                            introduced through the  anus and advanced to the the                            cecum, identified by appendiceal orifice and                            ileocecal valve. The ileocecal valve, appendiceal                            orifice, and rectum were photographed. The quality                            of the bowel preparation was good. The colonoscopy                            was performed without difficulty. The patient                            tolerated the procedure well. Scope In: 3:52:49 PM Scope Out: 4:08:24 PM Scope Withdrawal Time: 0 hours 11 minutes 2 seconds  Total Procedure Duration: 0 hours 15 minutes 35 seconds  Findings:                 The perianal and digital rectal examinations were                            normal.                           A 6 mm polyp was found in the sigmoid colon. The                            polyp was sessile. The polyp was removed with a                            cold snare. Resection and retrieval were complete.                           A few small-mouthed diverticula were found in the  left colon. There was no evidence of diverticular                            bleeding.                           A diffuse area of mild melanosis was found in the                            entire colon.                           The exam was otherwise without abnormality on                            direct and retroflexion views. Complications:            No immediate complications. Estimated blood loss:                            None. Estimated Blood Loss:     Estimated blood loss: none. Impression:               - One 6 mm polyp in the sigmoid colon, removed with                            a cold snare. Resected and retrieved.                           - Mild diverticulosis in the left colon.                           - Melanosis in the colon.                           - The examination was otherwise normal on direct                             and retroflexion views. Recommendation:           - Patient has a contact number available for                            emergencies. The signs and symptoms of potential                            delayed complications were discussed with the                            patient. Return to normal activities tomorrow.                            Written discharge instructions were provided to the                            patient.                           -  Resume previous diet.                           - Continue present medications.                           - Await pathology results.                           - Repeat colonoscopy after studies are complete for                            surveillance, likely 5-10 year, based on pathology                            results per patient request to continue routine                            colonoscopies. Ladene Artist, MD 06/21/2022 4:13:08 PM This report has been signed electronically.

## 2022-06-21 NOTE — Progress Notes (Signed)
History & Physical  Primary Care Physician:  Marin Olp, MD Primary Gastroenterologist: Lucio Edward, MD  CHIEF COMPLAINT:  Personal history of colon polyps   HPI: Ruth Shields is a 76 y.o. female with a personal history of adenomatous colon polyps for colonoscopy.   Past Medical History:  Diagnosis Date   Cancer ([place])    basal cell, squamous cell skin CA   Heart murmur    Hypertension    Premature ventricular contractions (PVCs) (VPCs)    Rectocele     Past Surgical History:  Procedure Laterality Date   BREAST BIOPSY     x2- benign   BREAST EXCISIONAL BIOPSY Right    20 years ago x 2   COLONOSCOPY     COLONOSCOPY W/ BIOPSIES AND POLYPECTOMY  2004   Dr. Cristal Ford, [place]     left   KNEE ARTHROSCOPY     left and right- torn meniscus- [facility]   LEFT HEART CATH AND CORONARY ANGIOGRAPHY N/A 05/28/2017   Procedure: Left Heart Cath and Coronary Angiography;  Surgeon: Sherren Mocha, MD;  Location: [facility];  Service: Cardiovascular;  Laterality: N/A;   POLYPECTOMY     SKIN CANCER EXCISION     Dr. Delman Cheadle    Prior to Admission medications   Medication Sig Start Date End Date Taking? Authorizing Provider  calcium carbonate (OS-CAL) 1250 (500 Ca) MG chewable tablet Chew 1 tablet by mouth daily.   Yes [provider]  ezetimibe (ZETIA) 10 MG tablet Take 1 tablet (10 mg total) by mouth daily. 01/01/22 06/21/22 Yes Sherren Mocha, MD  Multiple Vitamins-Minerals (MULTIVITAMIN WITH MINERALS) tablet Take 1 tablet by mouth daily.   Yes [provider]  Multiple Vitamins-Minerals (PRESERVISION AREDS) TABS Take by mouth daily. Per patient taking 2 tablets daily   Yes [provider]  progesterone (PROMETRIUM) 100 MG capsule Take 100 mg by mouth every evening.    Yes [provider]  spironolactone (ALDACTONE) 25 MG tablet Take 1 tablet (25 mg total) by mouth daily. 09/12/21  Yes Marin Olp, MD   valsartan-hydrochlorothiazide (DIOVAN-HCT) 80-12.5 MG tablet Take 1 tablet by mouth daily. 09/12/21  Yes Marin Olp, MD  Vitamin D, Ergocalciferol, (DRISDOL) 50000 UNITS CAPS capsule Take 50,000 Units by mouth every 7 (seven) days.   Yes [provider]  diclofenac (VOLTAREN) 75 MG EC tablet Take 75 mg by mouth 2 (two) times daily as needed (headaches).     [provider]  estradiol (VIVELLE-DOT) 0.0375 MG/24HR Place 1 patch onto the skin 2 (two) times a week.    [provider]  nitroGLYCERIN (NITROSTAT) 0.4 MG SL tablet Place 1 tablet (0.4 mg total) under the tongue every 5 (five) minutes as needed for chest pain. Patient not taking: Reported on 05/23/2022 01/01/22   Sherren Mocha, MD  rosuvastatin (CRESTOR) 5 MG tablet Take 1 tablet (5 mg total) by mouth daily. Patient not taking: Reported on 06/21/2022 06/14/21   Sherren Mocha, MD  senna (SENOKOT) 8.6 MG tablet Take 1 tablet by mouth daily.    [provider]    Current Outpatient Medications  Medication Sig Dispense Refill   calcium carbonate (OS-CAL) 1250 (500 Ca) MG chewable tablet Chew 1 tablet by mouth daily.     ezetimibe (ZETIA) 10 MG tablet Take 1 tablet (10 mg total) by mouth daily. 90 tablet 3   Multiple Vitamins-Minerals (MULTIVITAMIN WITH MINERALS) tablet Take 1 tablet by  mouth daily.     Multiple Vitamins-Minerals (PRESERVISION AREDS) TABS Take by mouth daily. Per patient taking 2 tablets daily     progesterone (PROMETRIUM) 100 MG capsule Take 100 mg by mouth every evening.      spironolactone (ALDACTONE) 25 MG tablet Take 1 tablet (25 mg total) by mouth daily. 100 tablet 3   valsartan-hydrochlorothiazide (DIOVAN-HCT) 80-12.5 MG tablet Take 1 tablet by mouth daily. 100 tablet 3   Vitamin D, Ergocalciferol, (DRISDOL) 50000 UNITS CAPS capsule Take 50,000 Units by mouth every 7 (seven) days.     diclofenac (VOLTAREN) 75 MG EC tablet Take 75 mg by mouth 2 (two) times daily as needed  (headaches).      estradiol (VIVELLE-DOT) 0.0375 MG/24HR Place 1 patch onto the skin 2 (two) times a week.     nitroGLYCERIN (NITROSTAT) 0.4 MG SL tablet Place 1 tablet (0.4 mg total) under the tongue every 5 (five) minutes as needed for chest pain. (Patient not taking: Reported on 05/23/2022) 25 tablet 3   rosuvastatin (CRESTOR) 5 MG tablet Take 1 tablet (5 mg total) by mouth daily. (Patient not taking: Reported on 06/21/2022) 90 tablet 3   senna (SENOKOT) 8.6 MG tablet Take 1 tablet by mouth daily.     Current Facility-Administered Medications  Medication Dose Route Frequency Provider Last Rate Last Admin   0.9 %  sodium chloride infusion  500 mL Intravenous Once Ladene Artist, MD        Allergies as of 06/21/2022   (No Known Allergies)    Family History  Problem Relation Age of Onset   Atrial fibrillation Mother        25 and independent   Cancer Father        lymphoma   Hypertension Father    Parkinson's disease Father        72   Healthy Brother    Other Brother        died in [place]   Colon cancer Neg Hx    Stomach cancer Neg Hx    Rectal cancer Neg Hx    Esophageal cancer Neg Hx     Social History   Socioeconomic History   Marital status: Married    Spouse name: Not on file   Number of children: Not on file   Years of education: Not on file   Highest education level: Not on file  Occupational History   Not on file  Tobacco Use   Smoking status: Never   Smokeless tobacco: Never  Vaping Use   Vaping Use: Never used  Substance and Sexual Activity   Alcohol use: Yes    Alcohol/week: 7.0 standard drinks of alcohol    Types: 7 Glasses of wine per week   Drug use: No   Sexual activity: Not on file  Other Topics Concern   Not on file  Social History Narrative   Married. 2 children (84 son with 23 sons, 53 year old married- horse and dogs) in 2017.    Lives in [place].       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      Hobbies: riding bikes, ski, yoga, workout at [employer], reading, time with mom ([place]- 3 hour drive)   Social Determinants of Health   Financial Resource Strain: Low Risk  (12/14/2021)   Overall Financial Resource Strain (CARDIA)  Difficulty of Paying Living Expenses: Not hard at all  Food Insecurity: No Food Insecurity (12/14/2021)   Hunger Vital Sign    Worried About Running Out of Food in the Last Year: Never true    Ran Out of Food in the Last Year: Never true  Transportation Needs: No Transportation Needs (12/14/2021)   PRAPARE - Hydrologist (Medical): No    Lack of Transportation (Non-Medical): No  Physical Activity: Sufficiently Active (12/14/2021)   Exercise Vital Sign    Days of Exercise per Week: 4 days    Minutes of Exercise per Session: 60 min  Stress: No Stress Concern Present (12/14/2021)   [place]    Feeling of Stress : Not at all  Social Connections: Moderately Integrated (12/14/2021)   Social Connection and Isolation Panel [NHANES]    Frequency of Communication with Friends and Family: More than three times a week    Frequency of Social Gatherings with Friends and Family: More than three times a week    Attends Religious Services: More than 4 times per year    Active Member of [employer] or Organizations: No    Attends Archivist Meetings: Never    Marital Status: Married  Human resources officer Violence: Not At Risk (12/14/2021)   Humiliation, Afraid, Rape, and Kick questionnaire    Fear of Current or Ex-Partner: No    Emotionally Abused: No    Physically Abused: No    Sexually Abused: No    Review of Systems:  All systems reviewed were negative except where noted in HPI.   Physical Exam: General:  Alert, well-developed, in NAD Head:  Normocephalic and atraumatic. Eyes:  Sclera clear, no  icterus.   Conjunctiva pink. Ears:  Normal auditory acuity. Mouth:  No deformity or lesions.  Neck:  Supple; no masses . Lungs:  Clear throughout to auscultation.   No wheezes, crackles, or rhonchi. No acute distress. Heart:  Regular rate and rhythm; no murmurs. Abdomen:  Soft, nondistended, nontender. No masses, hepatomegaly. No obvious masses.  Normal bowel .    Rectal:  Deferred   Msk:  Symmetrical without gross deformities.. Pulses:  Normal pulses noted. Extremities:  Without edema. Neurologic:  Alert and  oriented x4;  grossly normal neurologically. Skin:  Intact without significant lesions or rashes. Cervical Nodes:  No significant cervical adenopathy. Psych:  Alert and cooperative. Normal mood and affect.  Impression / Plan:   Personal history of adenomatous colon polyps for colonoscopy.  Pricilla Riffle. [place] Plan  06/21/2022, 3:40 PM See Shea Evans, [facility], to contact our on call provider

## 2022-06-21 NOTE — Progress Notes (Signed)
VS completed by DT.  Pt's states no medical or surgical changes since previsit or office visit.  

## 2022-06-21 NOTE — Progress Notes (Signed)
PT taken to PACU. Monitors in place. VSS. Report given to RN. 

## 2022-06-21 NOTE — Progress Notes (Signed)
Called to room to assist during endoscopic procedure.  Patient ID and intended procedure confirmed with present staff. Received instructions for my participation in the procedure from the performing physician.  

## 2022-06-21 NOTE — Patient Instructions (Signed)

## 2022-06-22 ENCOUNTER — Telehealth: Payer: Self-pay | Admitting: *Deleted

## 2022-06-22 NOTE — Telephone Encounter (Signed)
Attempt for follow up phone call. No answer at number given.  Left message on voicemail.   

## 2022-07-03 ENCOUNTER — Encounter: Payer: Self-pay | Admitting: Gastroenterology

## 2022-08-20 ENCOUNTER — Other Ambulatory Visit: Payer: Self-pay | Admitting: Family Medicine

## 2022-09-03 ENCOUNTER — Encounter: Payer: Self-pay | Admitting: *Deleted

## 2022-09-13 ENCOUNTER — Encounter: Payer: Self-pay | Admitting: Family Medicine

## 2022-09-13 ENCOUNTER — Ambulatory Visit (INDEPENDENT_AMBULATORY_CARE_PROVIDER_SITE_OTHER): Payer: Medicare Other | Admitting: Family Medicine

## 2022-09-13 VITALS — BP 100/64 | HR 75 | Temp 97.9°F | Ht 65.0 in | Wt 147.2 lb

## 2022-09-13 DIAGNOSIS — I10 Essential (primary) hypertension: Secondary | ICD-10-CM | POA: Diagnosis not present

## 2022-09-13 DIAGNOSIS — E785 Hyperlipidemia, unspecified: Secondary | ICD-10-CM | POA: Diagnosis not present

## 2022-09-13 DIAGNOSIS — I7121 Aneurysm of the ascending aorta, without rupture: Secondary | ICD-10-CM

## 2022-09-13 DIAGNOSIS — Z23 Encounter for immunization: Secondary | ICD-10-CM

## 2022-09-13 DIAGNOSIS — M810 Age-related osteoporosis without current pathological fracture: Secondary | ICD-10-CM | POA: Diagnosis not present

## 2022-09-13 DIAGNOSIS — I2510 Atherosclerotic heart disease of native coronary artery without angina pectoris: Secondary | ICD-10-CM | POA: Diagnosis not present

## 2022-09-13 DIAGNOSIS — G720 Drug-induced myopathy: Secondary | ICD-10-CM

## 2022-09-13 LAB — COMPREHENSIVE METABOLIC PANEL
ALT: 15 U/L (ref 0–35)
AST: 27 U/L (ref 0–37)
Albumin: 4.2 g/dL (ref 3.5–5.2)
Alkaline Phosphatase: 34 U/L — ABNORMAL LOW (ref 39–117)
BUN: 18 mg/dL (ref 6–23)
CO2: 28 mEq/L (ref 19–32)
Calcium: 9.7 mg/dL (ref 8.4–10.5)
Chloride: 101 mEq/L (ref 96–112)
Creatinine, Ser: 0.86 mg/dL (ref 0.40–1.20)
GFR: 66.69 mL/min (ref >60.00)
Glucose, Bld: 84 mg/dL (ref 70–99)
Potassium: 3.5 mEq/L (ref 3.5–5.1)
Sodium: 135 mEq/L (ref 135–145)
Total Bilirubin: 0.5 mg/dL (ref 0.2–1.2)
Total Protein: 6.8 g/dL (ref 6.0–8.3)

## 2022-09-13 LAB — CBC WITH DIFFERENTIAL/PLATELET
Basophils Absolute: 0 10*3/uL (ref 0.0–0.1)
Basophils Relative: 0.6 % (ref 0.0–3.0)
Eosinophils Absolute: 0.1 10*3/uL (ref 0.0–0.7)
Eosinophils Relative: 1.1 % (ref 0.0–5.0)
HCT: 36.4 % (ref 36.0–46.0)
Hemoglobin: 12.4 g/dL (ref 12.0–15.0)
Lymphocytes Relative: 25.3 % (ref 12.0–46.0)
Lymphs Abs: 1.4 10*3/uL (ref 0.7–4.0)
MCHC: 34.2 g/dL (ref 30.0–36.0)
MCV: 94.2 fl (ref 78.0–100.0)
Monocytes Absolute: 0.5 10*3/uL (ref 0.1–1.0)
Monocytes Relative: 8.4 % (ref 3.0–12.0)
Neutro Abs: 3.6 10*3/uL (ref 1.4–7.7)
Neutrophils Relative %: 64.6 % (ref 43.0–77.0)
Platelets: 234 10*3/uL (ref 150.0–400.0)
RBC: 3.87 Mil/uL (ref 3.87–5.11)
RDW: 13.5 % (ref 11.5–15.5)
WBC: 5.6 10*3/uL (ref 4.0–10.5)

## 2022-09-13 LAB — URINALYSIS, ROUTINE W REFLEX MICROSCOPIC
Bilirubin Urine: NEGATIVE
Hgb urine dipstick: NEGATIVE
Ketones, ur: NEGATIVE
Leukocytes,Ua: NEGATIVE
Nitrite: NEGATIVE
Specific Gravity, Urine: 1.01 (ref 1.000–1.030)
Total Protein, Urine: NEGATIVE
Urine Glucose: NEGATIVE
Urobilinogen, UA: 0.2 (ref 0.0–1.0)
pH: 6 (ref 5.0–8.0)

## 2022-09-13 LAB — LIPID PANEL
Cholesterol: 175 mg/dL (ref 0–200)
HDL: 79.5 mg/dL (ref >39.00)
LDL Cholesterol: 85 mg/dL (ref 0–99)
NonHDL: 95.44
Total CHOL/HDL Ratio: 2
Triglycerides: 51 mg/dL (ref 0.0–149.0)
VLDL: 10.2 mg/dL (ref 0.0–40.0)

## 2022-09-13 LAB — VITAMIN D 25 HYDROXY (VIT D DEFICIENCY, FRACTURES): VITD: 72.06 ng/mL (ref 30.00–100.00)

## 2022-09-13 NOTE — Progress Notes (Signed)
Phone [phone] In person visit   Subjective:   Ruth Shields is a 76 y.o. year old very pleasant female patient who presents for/with See problem oriented charting Chief Complaint  Patient presents with   Annual Exam    Fasting.    Hypertension    Past Medical History-  Patient Active Problem List   Diagnosis Date Noted   Nonobstructive CAD per cath 2018-Dr. Cooper 03/19/2019    Priority: High   Aneurysm of ascending aorta ([place]) 05/23/2017    Priority: High   Hyperlipidemia 11/13/2017    Priority: Medium    Osteoporosis 11/12/2016    Priority: Medium    Hormone replacement therapy 11/12/2016    Priority: Medium    Essential hypertension 03/21/2010    Priority: Medium    GERD (gastroesophageal reflux disease) 11/12/2016    Priority: Low   Palpitations 07/31/2014    Priority: Low   MURMUR 03/21/2010    Priority: Low   Abnormal stress echo 05/28/2017    Medications- reviewed and updated Current Outpatient Medications  Medication Sig Dispense Refill   calcium carbonate (OS-CAL) 1250 (500 Ca) MG chewable tablet Chew 1 tablet by mouth daily.     cholecalciferol (VITAMIN D3) 25 MCG (1000 UNIT) tablet Take 4,000 Units by mouth daily. As part of combo pill     estradiol (VIVELLE-DOT) 0.0375 MG/24HR Place 1 patch onto the skin 2 (two) times a week.     Multiple Vitamins-Minerals (MULTIVITAMIN WITH MINERALS) tablet Take 1 tablet by mouth daily.     Multiple Vitamins-Minerals (PRESERVISION AREDS) TABS Take by mouth daily. Per patient taking 2 tablets daily     nitroGLYCERIN (NITROSTAT) 0.4 MG SL tablet Place 1 tablet (0.4 mg total) under the tongue every 5 (five) minutes as needed for chest pain. 25 tablet 3   progesterone (PROMETRIUM) 100 MG capsule Take 100 mg by mouth every evening.      senna (SENOKOT) 8.6 MG tablet Take 1 tablet by mouth daily.     spironolactone (ALDACTONE) 25 MG tablet TAKE 1 TABLET DAILY 100 tablet 3   valsartan-hydrochlorothiazide (DIOVAN-HCT)  80-12.5 MG tablet TAKE 1 TABLET DAILY 100 tablet 3   ezetimibe (ZETIA) 10 MG tablet Take 1 tablet (10 mg total) by mouth daily. 90 tablet 3   No current facility-administered medications for this visit.     Objective:  BP 100/64   Pulse 75   Temp 97.9 F (36.6 C)   Ht '5\' 5"'$  (1.651 m)   Wt 147 lb 3.2 oz (66.8 kg)   SpO2 96%   BMI 24.50 kg/m  Gen: NAD, resting comfortably TM normal, oropharynx normal CV: RRR no murmurs rubs or gallops Lungs: CTAB no crackles, wheeze, rhonchi Abdomen: soft/nontender/nondistended/normal bowel sounds. No rebound or guarding.  Ext: no edema Skin: warm, dry Neuro: grossly normal, moves all extremities     Assessment and Plan   #Social update-lost mom on mothers day- hard on her- planning to sell the townhome. Plus lost her dog- very close and hard on her as well.   #Health maintenance 1.  Colon cancer screening-Dr. Fuller Plan colonoscopy 06/21/22- repeat colonoscopy in 5 to 10 years 2.  Breast cancer screening-12/12/2021 3. [facility] for skin cancer screening- Dr. Delman Cheadle- had squamous removed 3. Updated pfizer shot on 09/06/22, flu shot today- otherwise uptodate   #hypertension S: medication: Spironolactone 25 mg, valsartan hydrochlorothiazide 80-12.5 mg Home readings #s: Sparingly checks BP Readings from Last 3 Encounters:  09/13/22 100/64  06/21/22 119/74  01/01/22 106/68  A/P: Well-controlled-continue current medication  #CAD-mild nonobstructive but primarily affecting the LAD based on catheterization with Dr. Burt Knack 2018 #hyperlipidemia S: Medication:At last cardiology visit plan was to trial Zetia 10 mg as she was unable to tolerate statin completely- she is tolerating this . Also takes omega 3 -Rosuvastatin 5 mg even down to twice a week was having myalgais -Was having trouble with bruising on aspirin last year -Has not had to use nitroglycerin-denies chest pain or shortness of breath Lab Results  Component Value Date   CHOL 181  04/09/2022   HDL 72 04/09/2022   LDLCALC 97 04/09/2022   TRIG 62 04/09/2022   CHOLHDL 2.5 04/09/2022   A/P: Nonobstructive CAD asymptomatic.  Still off asa due to bruising - Hyperlipidemia improved with LDL down from 121  to 97 with zetia- thankful for improvement since has not tolerated statins- remove rosuvastatin from list   # GERD S:Medication: Sparing Pepcid A/P: Reasonable control with sparing use-continue current medication  #Osteoporosis S: Has taken Fosamax in the past over 20 years ago.  On bone density in 2019 worst T score was -1.2-seem to have significant response to weightbearing exercise (does weight training- functional strength trainer) as well as distant Fosamax and vitamin D repletion -also walks 2 miles a day -On bone density 02/26/2022 with Dr. Maureen Ralphs had normal bone density! A/P: I cannot fully explain the ongoing improvement in bone density (other than weight bearing exercise improving) but we are both thrilled-continue current medications primarily with vitamin D -check vitamin D  down from 50k units a week to 5000 units a day (4k in one combo pill and 1 k with calcium it sounds like) update and hopefully not too high   #insomnia- sometimes thoughts racing or aches from the gym- melatonin 10 mg helps- could try just 5 or 2.5 mg. Also takes a magnesium supplement- no loose stools on this (tends toward the opposite). Also takes fiber and pushes water.   #Ascending aortic aneurysm S: Patient incidental finding during chest pain work-up May 21, 2017 at 4.2 cm.  Continues to follow close with Dr. Burt Knack.  Most recent imaging showed stable aneurysm 4.3 cm on 10/24/2021 A/P: plan is to transition to every 2 years per her report- has been doing well    #Hormone replacement therapy-patient prefers to continue with GYN-aware of potential risks with Dr. Runell Gess   #High risk medication use-on advil or aleve  for pain after workouts maybe once a week- monitor renal  function.   Recommended follow up: Return in about 1 year (around 09/14/2023) for followup or sooner if needed.Schedule b4 you leave. Future Appointments  Date Time Provider Tea  12/21/2022 11:45 AM [facility]   Lab/Order associations: late breakfast today   ICD-10-CM   1. Coronary artery disease involving native coronary artery of native heart without angina pectoris  I25.10     2. Ascending aortic aneurysm, unspecified whether ruptured ([place])  I71.21     3. Hyperlipidemia, unspecified hyperlipidemia type  E78.5 Lipid panel    4. 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 Age-related osteoporosis without current pathological fracture  M81.0 VITAMIN D 25 Hydroxy (Vit-D Deficiency, Fractures)    7. Need for immunization against influenza  Z23 Flu Vaccine QUAD High Dose(Fluad)     No orders of the defined types were  placed in this encounter.  Return precautions advised.  Garret Reddish, MD

## 2022-09-13 NOTE — Patient Instructions (Addendum)
Please stop by lab before you go If you have mychart- we will send your results within 3 business days of [place] receiving them.  If you do not have mychart- we will call you about results within 5 business days of [place] receiving them.  *please also note that you will see labs on mychart as soon as they post. I will later go in and write notes on them- will say "notes from Dr. Yong Channel"   Recommended follow up: Return in about 1 year (around 09/14/2023) for followup or sooner if needed.Schedule b4 you leave.

## 2022-10-29 ENCOUNTER — Other Ambulatory Visit: Payer: Self-pay | Admitting: Obstetrics and Gynecology

## 2022-10-29 DIAGNOSIS — R921 Mammographic calcification found on diagnostic imaging of breast: Secondary | ICD-10-CM

## 2022-12-10 ENCOUNTER — Other Ambulatory Visit: Payer: Self-pay | Admitting: Cardiovascular Disease

## 2022-12-13 ENCOUNTER — Ambulatory Visit
Admission: RE | Admit: 2022-12-13 | Discharge: 2022-12-13 | Disposition: A | Discharge status: Home / Self Care | Payer: Medicare Other | Source: Ambulatory Visit | Attending: Obstetrics and Gynecology | Admitting: Obstetrics and Gynecology

## 2022-12-13 DIAGNOSIS — R921 Mammographic calcification found on diagnostic imaging of breast: Secondary | ICD-10-CM

## 2022-12-21 ENCOUNTER — Ambulatory Visit (INDEPENDENT_AMBULATORY_CARE_PROVIDER_SITE_OTHER): Payer: Medicare Other

## 2022-12-21 VITALS — Wt 145.0 lb

## 2022-12-21 DIAGNOSIS — Z0000 Encounter for general adult medical examination without abnormal findings: Secondary | ICD-10-CM

## 2022-12-21 NOTE — Patient Instructions (Signed)
Ruth Shields , Thank you for taking time to come for your Medicare Wellness Visit. I appreciate your ongoing commitment to your health goals. Please review the following plan we discussed and let me know if I can assist you in the future.   These are the goals we discussed:  Goals      Patient Stated     Stay fit and don't gain weight      Patient Stated     Lose 5 lbs and more agility         This is a list of the screening recommended for you and due dates:  Health Maintenance  Topic Date Due   COVID-19 Vaccine (4 - 2023-24 season) 11/01/2022   Medicare Annual Wellness Visit  12/22/2023   Mammogram  12/13/2024   Colon Cancer Screening  06/22/2027   DTaP/Tdap/Td vaccine (4 - Td or Tdap) 04/13/2029   Pneumonia Vaccine  Completed   Flu Shot  Completed   DEXA scan (bone density measurement)  Completed   Zoster (Shingles) Vaccine  Completed   Hepatitis C Screening: USPSTF Recommendation to screen - Ages 83-79 yo.  Addressed   HPV Vaccine  Aged Out    Advanced directives: Please bring a copy of your health care power of attorney and living will to the office at your convenience.  Conditions/risks identified: lose 5 lbs and gain more agility   Next appointment: Follow up in one year for your annual wellness visit.   Preventive Care 21 Years and Older, Female  Preventive care refers to lifestyle choices and visits with your health care provider that can promote health and wellness. What does preventive care include? A yearly physical exam. This is also called an annual well check. Dental exams once or twice a year. Routine eye exams. Ask your health care provider how often you should have your eyes checked. Personal lifestyle choices, including: Daily care of your teeth and gums. Regular physical activity. Eating a healthy diet. Avoiding tobacco and drug use. Limiting alcohol use. Practicing safe sex. Taking low doses of aspirin every day. Taking vitamin and mineral  supplements as recommended by your health care provider. What happens during an annual well check? The services and screenings done by your health care provider during your annual well check will depend on your age, overall health, lifestyle risk factors, and family history of disease. Counseling  Your health care provider may ask you questions about your: Alcohol use. Tobacco use. Drug use. Emotional well-being. Home and relationship well-being. Sexual activity. Eating habits. History of falls. Memory and ability to understand (cognition). Work and work Statistician. Screening  You may have the following tests or measurements: Height, weight, and BMI. Blood pressure. Lipid and cholesterol levels. These may be checked every 5 years, or more frequently if you are over 15 years old. Skin check. Lung cancer screening. You may have this screening every year starting at age 42 if you have a 30-pack-year history of smoking and currently smoke or have quit within the past 15 years. Fecal occult blood test (FOBT) of the stool. You may have this test every year starting at age 76. Flexible sigmoidoscopy or colonoscopy. You may have a sigmoidoscopy every 5 years or a colonoscopy every 10 years starting at age 78. Prostate cancer screening. Recommendations will vary depending on your family history and other risks. Hepatitis C blood test. Hepatitis B blood test. Sexually transmitted disease (STD) testing. Diabetes screening. This is done by checking your blood sugar (glucose)  after you have not eaten for a while (fasting). You may have this done every 1-3 years. Abdominal aortic aneurysm (AAA) screening. You may need this if you are a current or former smoker. Osteoporosis. You may be screened starting at age 68 if you are at high risk. Talk with your health care provider about your test results, treatment options, and if necessary, the need for more tests. Vaccines  Your health care provider  may recommend certain vaccines, such as: Influenza vaccine. This is recommended every year. Tetanus, diphtheria, and acellular pertussis (Tdap, Td) vaccine. You may need a Td booster every 10 years. Zoster vaccine. You may need this after age 18. Pneumococcal 13-valent conjugate (PCV13) vaccine. One dose is recommended after age 7. Pneumococcal polysaccharide (PPSV23) vaccine. One dose is recommended after age 59. Talk to your health care provider about which screenings and vaccines you need and how often you need them. This information is not intended to replace advice given to you by your health care provider. Make sure you discuss any questions you have with your health care provider. Document Released: 12/23/2015 Document Revised: 08/15/2016 Document Reviewed: 09/27/2015 Elsevier Interactive Patient Education  2017 [place] Prevention in the Home Falls can cause injuries. They can happen to people of all ages. There are many things you can do to make your home safe and to help prevent falls. What can I do on the outside of my home? Regularly fix the edges of walkways and driveways and fix any cracks. Remove anything that might make you trip as you walk through a door, such as a raised step or threshold. Trim any bushes or trees on the path to your home. Use bright outdoor lighting. Clear any walking paths of anything that might make someone trip, such as rocks or tools. Regularly check to see if handrails are loose or broken. Make sure that both sides of any steps have handrails. Any raised decks and porches should have guardrails on the edges. Have any leaves, snow, or ice cleared regularly. Use sand or salt on walking paths during winter. Clean up any spills in your garage right away. This includes oil or grease spills. What can I do in the bathroom? Use night lights. Install grab bars by the toilet and in the tub and shower. Do not use towel bars as grab bars. Use  non-skid mats or decals in the tub or shower. If you need to sit down in the shower, use a plastic, non-slip stool. Keep the floor dry. Clean up any water that spills on the floor as soon as it happens. Remove soap buildup in the tub or shower regularly. Attach bath mats securely with double-sided non-slip rug tape. Do not have throw rugs and other things on the floor that can make you trip. What can I do in the bedroom? Use night lights. Make sure that you have a light by your bed that is easy to reach. Do not use any sheets or blankets that are too big for your bed. They should not hang down onto the floor. Have a firm chair that has side arms. You can use this for support while you get dressed. Do not have throw rugs and other things on the floor that can make you trip. What can I do in the kitchen? Clean up any spills right away. Avoid walking on wet floors. Keep items that you use a lot in easy-to-reach places. If you need to reach something above you, use a  strong step stool that has a grab bar. Keep electrical cords out of the way. Do not use floor polish or wax that makes floors slippery. If you must use wax, use non-skid floor wax. Do not have throw rugs and other things on the floor that can make you trip. What can I do with my stairs? Do not leave any items on the stairs. Make sure that there are handrails on both sides of the stairs and use them. Fix handrails that are broken or loose. Make sure that handrails are as long as the stairways. Check any carpeting to make sure that it is firmly attached to the stairs. Fix any carpet that is loose or worn. Avoid having throw rugs at the top or bottom of the stairs. If you do have throw rugs, attach them to the floor with carpet tape. Make sure that you have a light switch at the top of the stairs and the bottom of the stairs. If you do not have them, ask someone to add them for you. What else can I do to help prevent falls? Wear  shoes that: Do not have high heels. Have rubber bottoms. Are comfortable and fit you well. Are closed at the toe. Do not wear sandals. If you use a stepladder: Make sure that it is fully opened. Do not climb a closed stepladder. Make sure that both sides of the stepladder are locked into place. Ask someone to hold it for you, if possible. Clearly mark and make sure that you can see: Any grab bars or handrails. First and last steps. Where the edge of each step is. Use tools that help you move around (mobility aids) if they are needed. These include: Canes. Walkers. Scooters. Crutches. Turn on the lights when you go into a dark area. Replace any light bulbs as soon as they burn out. Set up your furniture so you have a clear path. Avoid moving your furniture around. If any of your floors are uneven, fix them. If there are any pets around you, be aware of where they are. Review your medicines with your doctor. Some medicines can make you feel dizzy. This can increase your chance of falling. Ask your doctor what other things that you can do to help prevent falls. This information is not intended to replace advice given to you by your health care provider. Make sure you discuss any questions you have with your health care provider. Document Released: 09/22/2009 Document Revised: 05/03/2016 Document Reviewed: 12/31/2014 Elsevier Interactive Patient Education  2017 [employer].

## 2022-12-21 NOTE — Progress Notes (Cosign Needed Addendum)
I connected with  Ruth Shields on 12/21/22 by a audio enabled telemedicine application and verified that I am speaking with the correct person using two identifiers.  Patient Location: Home  Provider Location: Office/Clinic  I discussed the limitations of evaluation and management by telemedicine. The patient expressed understanding and agreed to proceed.  Subjective:   Ruth Shields is a 77 y.o. female who presents for Medicare Annual (Subsequent) preventive examination.  Review of Systems     Cardiac Risk Factors include: advanced age (>5mn, >>4women);hypertension;dyslipidemia     Objective:    Today's Vitals   12/21/22 1133  Weight: 145 lb (65.8 kg)   Body mass index is 24.13 kg/m.     12/21/2022   11:38 AM 12/14/2021    9:36 AM 05/28/2017    9:28 AM 05/10/2017    9:36 AM 04/24/2017    1:43 PM  Advanced Directives  Does Patient Have a Medical Advance Directive? Yes Yes Yes Yes Yes  Type of [place] will Healthcare Power of [place] will  [place] will  Does patient want to make changes to medical advance directive?   No - Patient declined    Copy of H[place] Chart? No - copy requested No - copy requested No - copy requested      Current Medications (verified) Outpatient Encounter Medications as of 12/21/2022  Medication Sig   calcium carbonate (OS-CAL) 1250 (500 Ca) MG chewable tablet Chew 1 tablet by mouth daily.   cholecalciferol (VITAMIN D3) 25 MCG (1000 UNIT) tablet Take 4,000 Units by mouth daily. As part of combo pill   estradiol (VIVELLE-DOT) 0.0375 MG/24HR Place 1 patch onto the skin 2 (two) times a week.   ezetimibe (ZETIA) 10 MG tablet TAKE 1 TABLET DAILY   Multiple Vitamins-Minerals (MULTIVITAMIN WITH MINERALS) tablet Take 1 tablet by mouth daily.   Multiple Vitamins-Minerals (PRESERVISION AREDS) TABS Take by mouth daily. Per  patient taking 2 tablets daily   nitroGLYCERIN (NITROSTAT) 0.4 MG SL tablet Place 1 tablet (0.4 mg total) under the tongue every 5 (five) minutes as needed for chest pain.   progesterone (PROMETRIUM) 100 MG capsule Take 100 mg by mouth every evening.    senna (SENOKOT) 8.6 MG tablet Take 1 tablet by mouth daily.   spironolactone (ALDACTONE) 25 MG tablet TAKE 1 TABLET DAILY   valsartan-hydrochlorothiazide (DIOVAN-HCT) 80-12.5 MG tablet TAKE 1 TABLET DAILY   No facility-administered encounter medications on file as of 12/21/2022.    Allergies (verified) Rosuvastatin   History: Past Medical History:  Diagnosis Date   Cancer ([place]    basal cell, squamous cell skin CA   Heart murmur    Hypertension    Premature ventricular contractions (PVCs) (VPCs)    Rectocele    Past Surgical History:  Procedure Laterality Date   BREAST BIOPSY     x2- benign   BREAST EXCISIONAL BIOPSY Right    20 years ago x 2   COLONOSCOPY     COLONOSCOPY W/ BIOPSIES AND POLYPECTOMY  2004   Dr. [place]    left   KNEE ARTHROSCOPY     left and right- torn meniscus- [facility]   LEFT HEART CATH AND CORONARY ANGIOGRAPHY N/A 05/28/2017   Procedure: Left Heart Cath and Coronary Angiography;  Surgeon: CSherren Mocha MD;  [facility];  Service: Cardiovascular;  Laterality: N/A;  POLYPECTOMY     SKIN CANCER EXCISION     Dr. Delman Cheadle   Family History  Problem Relation Age of Onset   Atrial fibrillation Mother    Stroke Mother        died at 74   Cancer Father        lymphoma   Hypertension Father    Parkinson's disease Father        103   Healthy Brother    Other Brother        died in [place]   Colon cancer Neg Hx    Stomach cancer Neg Hx    Rectal cancer Neg Hx    Esophageal cancer Neg Hx    Social History   Socioeconomic History   Marital status: Married    Spouse name: Not on file   Number of children: Not on file   Years of education: Not  on file   Highest education level: Not on file  Occupational History   Not on file  Tobacco Use   Smoking status: Never   Smokeless tobacco: Never  Vaping Use   Vaping Use: Never used  Substance and Sexual Activity   Alcohol use: Yes    Alcohol/week: 7.0 standard drinks of alcohol    Types: 7 Glasses of wine per week   Drug use: No   Sexual activity: Not on file  Other Topics Concern   Not on file  Social History Narrative   Married. 2 children (59 son with 37 sons, 51 year old married- horse and dogs) in 2017.    Lives in [place].       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      Hobbies: riding bikes, ski, yoga, workout at [employer], reading, time with mom ([place] 3 [place] drive)   Social Determinants of Health   Financial Resource Strain: Low Risk  (12/14/2021)   Overall Financial Resource Strain (CARDIA)    Difficulty of Paying Living Expenses: Not hard at all  Food Insecurity: No Food Insecurity (12/14/2021)   Hunger Vital Sign    Worried About Running Out of Food in the Last Year: Never true    [place] in the Last Year: Never true  Transportation Needs: No Transportation Needs (12/21/2022)   PRAPARE - Hydrologist (Medical): No    Lack of Transportation (Non-Medical): No  Physical Activity: Sufficiently Active (12/14/2021)   Exercise Vital Sign    Days of Exercise per Week: 4 days    Minutes of Exercise per Session: 60 min  Stress: No Stress Concern Present (12/21/2022)   Trooper    Feeling of Stress : Not at all  Social Connections: Unknown (12/17/2022)   Social Connection and Isolation Panel [NHANES]    Frequency of Communication with Friends and Family: Not on file    Frequency of Social Gatherings with Friends and Family: Not on file    Attends Religious  Services: More than 4 times per year    Active Member of [employer] or Organizations: Not on file    Attends Archivist Meetings: Not on file    Marital Status: Married    Tobacco Counseling Counseling given: Not Answered   Clinical Intake:  Pre-visit preparation completed: Yes  Pain : No/denies pain  BMI - recorded: 24.13 Nutritional Status: BMI of 19-24  Normal Nutritional Risks: None Diabetes: No  How often do you need to have someone help you when you read instructions, pamphlets, or other written materials from your doctor or pharmacy?: 1 - Never  Diabetic?no  Interpreter Needed?: No  Information entered by :: Charlott Rakes, LPN   Activities of Daily Living    12/17/2022    1:22 PM  In your present state of health, do you have any difficulty performing the following activities:  Hearing? 0  Vision? 0  Difficulty concentrating or making decisions? 0  Walking or climbing stairs? 0  Dressing or bathing? 0  Doing errands, shopping? 0  Preparing Food and eating ? N  Using the Toilet? N  In the past six months, have you accidently leaked urine? N  Do you have problems with loss of bowel control? N  Managing your Medications? N  Managing your Finances? N  Housekeeping or managing your Housekeeping? N    Patient Care Team: Marin Olp, MD as PCP - General (Family Medicine) Sherren Mocha, MD as PCP - Cardiology (Cardiology) Dian Queen, MD as Consulting Physician (Obstetrics and Gynecology) Sharyne Peach, MD as Consulting Physician (Ophthalmology) Ladene Artist, MD as Consulting Physician (Gastroenterology)  Indicate any recent Medical Services you may have received from other than [facility] providers in the past year (date may be approximate).     Assessment:   This is a routine wellness examination for [place].  Hearing/Vision screen Hearing Screening - Comments:: Pt denies any hearing issues  Vision Screening - Comments:: Pt follows up  with Dr Ellie Lunch for annual eye exams   Dietary issues and exercise activities discussed: Current Exercise Habits: Home exercise routine, Type of exercise: Other - see comments, Time (Minutes): > 60, Frequency (Times/Week): 7, Weekly Exercise (Minutes/Week): 0   Goals Addressed             This Visit's Progress    Patient Stated       Lose 5 lbs and more agility        Depression Screen    12/21/2022   11:36 AM 12/14/2021    9:34 AM 09/12/2021    1:24 PM 05/19/2020    1:10 PM 03/19/2019    1:09 PM 11/13/2017    8:45 AM 11/12/2016    8:14 AM  PHQ 2/9 Scores  PHQ - 2 Score 0 0 0 0 0 0 0    Fall Risk    12/17/2022    1:22 PM 12/14/2021    9:37 AM 09/12/2021    1:24 PM 05/19/2020    1:39 PM 10/30/2018    9:54 AM  [place] in the past year? 0 0 0 0 0  Comment     Emmi Telephone Survey: data to providers prior to load  Number falls in past yr: 0 0 0 0   Injury with Fall? 0 0 0 0   Risk for fall due to : Impaired vision  No Fall Risks    Follow up Falls prevention discussed  Falls evaluation completed      FALL RISK PREVENTION PERTAINING TO THE HOME:  Any stairs in or around the home? Yes  If so, are there any without handrails? No  Home free of loose throw rugs in walkways, pet beds, electrical cords, etc? Yes  Adequate lighting in your home to reduce risk of falls? Yes   ASSISTIVE DEVICES UTILIZED TO PREVENT FALLS:  Life alert? No  Use of a cane, walker or w/c? No  Grab bars in the bathroom? Yes  Shower chair or bench in shower? No  Elevated toilet seat or a handicapped toilet? No   TIMED UP AND GO:  Was the test performed? No .   Cognitive Function:        12/21/2022   11:39 AM 12/14/2021    9:39 AM  6CIT Screen  What Year? 0 points 0 points  What month? 0 points 0 points  What time? 0 points 0 points  Count back from 20 0 points 0 points  Months in reverse 0 points 0 points  Repeat phrase 0 points 0 points  Total Score 0 points 0 points     Immunizations Immunization History  Administered Date(s) Administered   Fluad Quad(high Dose 65+) 08/17/2021, 09/13/2022   Influenza-Unspecified 08/10/2014, 09/08/2014, 10/04/2015, 09/26/2016, 09/25/2017, 09/28/2019, 09/28/2020   Moderna SARS-COV2 Booster Vaccination 10/07/2020, 07/10/2021   Moderna Sars-Covid-2 Vaccination 12/17/2019, 01/14/2020   PFIZER Comirnaty(Gray Top)Covid-19 Tri-Sucrose Vaccine 09/06/2022   Pneumococcal Conjugate-13 04/28/2014   Pneumococcal Polysaccharide-23 05/19/2020   RSV,unspecified 11/11/2022   Td 12/10/2008, 03/24/2019   Tdap 04/14/2019   Zoster Recombinat (Shingrix) 09/03/2017, 02/21/2018    TDAP status: Up to date  Flu Vaccine status: Up to date  Pneumococcal vaccine status: Up to date  Covid-19 vaccine status: Completed vaccines  Qualifies for Shingles Vaccine? Yes   Zostavax completed Yes   Shingrix Completed?: Yes  Screening Tests Health Maintenance  Topic Date Due   COVID-19 Vaccine (4 - 2023-24 season) 11/01/2022   Medicare Annual Wellness (AWV)  12/22/2023   MAMMOGRAM  12/13/2024   COLONOSCOPY (Pts 45-13yr Insurance coverage will need to be confirmed)  06/22/2027   DTaP/Tdap/Td (4 - Td or Tdap) 04/13/2029   Pneumonia Vaccine 75 Years old  Completed   INFLUENZA VACCINE  Completed   DEXA SCAN  Completed   Zoster Vaccines- Shingrix  Completed   Hepatitis C Screening  Addressed   HPV VACCINES  Aged Out    Health Maintenance  Health Maintenance Due  Topic Date Due   COVID-19 Vaccine (4 - 2023-24 season) 11/01/2022    Colorectal cancer screening: Type of screening: Colonoscopy. Completed 06/27/22. Repeat every 5 years  Mammogram status: Completed 12/13/22. Repeat every year  Bone Density status: Completed 02/26/22. Results reflect: Bone density results: NORMAL. Repeat every 2 years.   Additional Screening:  Hepatitis C Screening: Completed 11/12/16  Vision Screening: Recommended annual ophthalmology exams for early  detection of glaucoma and other disorders of the eye. Is the patient up to date with their annual eye exam?  Yes  Who is the provider or what is the name of the office in which the patient attends annual eye exams? Dr MEllie Lunch If pt is not established with a provider, would they like to be referred to a provider to establish care? No .   Dental Screening: Recommended annual dental exams for proper oral hygiene  Community Resource Referral / Chronic Care Management: CRR required this visit?  No   CCM required this visit?  No      Plan:     I have personally reviewed and noted the following in the patient's chart:   Medical and social history Use of alcohol, tobacco or illicit drugs  Current medications and supplements including opioid prescriptions. Patient is not currently taking opioid prescriptions. Functional ability and status Nutritional status Physical activity Advanced directives List of other physicians Hospitalizations, surgeries, and ER  visits in previous 12 months Vitals Screenings to include cognitive, depression, and falls Referrals and appointments  In addition, I have reviewed and discussed with patient certain preventive protocols, quality metrics, and best practice recommendations. A written personalized care plan for preventive services as well as general preventive health recommendations were provided to patient.     Willette Brace, LPN   [id number]   Nurse Notes: none

## 2023-03-11 ENCOUNTER — Other Ambulatory Visit: Payer: Self-pay | Admitting: Cardiovascular Disease

## 2023-03-28 ENCOUNTER — Encounter: Payer: Self-pay | Admitting: Cardiovascular Disease

## 2023-04-02 ENCOUNTER — Other Ambulatory Visit: Payer: Self-pay | Admitting: *Deleted

## 2023-04-02 ENCOUNTER — Encounter: Payer: Self-pay | Admitting: Cardiology

## 2023-04-02 ENCOUNTER — Ambulatory Visit: Payer: Medicare Other | Attending: Physician Assistant | Admitting: Cardiology

## 2023-04-02 VITALS — BP 110/62 | HR 69 | Ht 64.5 in | Wt 147.6 lb

## 2023-04-02 DIAGNOSIS — I7121 Aneurysm of the ascending aorta, without rupture: Secondary | ICD-10-CM

## 2023-04-02 DIAGNOSIS — E785 Hyperlipidemia, unspecified: Secondary | ICD-10-CM

## 2023-04-02 DIAGNOSIS — I2510 Atherosclerotic heart disease of native coronary artery without angina pectoris: Secondary | ICD-10-CM

## 2023-04-02 DIAGNOSIS — I10 Essential (primary) hypertension: Secondary | ICD-10-CM | POA: Diagnosis not present

## 2023-04-02 MED ORDER — ATORVASTATIN CALCIUM 10 MG PO TABS: 10.0000 mg | ORAL_TABLET | ORAL | 3 refills | Status: DC

## 2023-04-02 MED ORDER — EZETIMIBE 10 MG PO TABS: 10.0000 mg | ORAL_TABLET | Freq: Every day | ORAL | 3 refills | Status: DC

## 2023-04-02 NOTE — Patient Instructions (Addendum)
Medication Instructions:  Your physician has recommended you make the following change in your medication:   START Atorvastatin 10 mg taking 1 tablet weekly  *If you need a refill on your cardiac medications before your next appointment, please call your pharmacy*   Lab Work: TODAY:  CMET & MAG  3 MONTHS:  GO TO [facility], FASTING, FOR:  LIPID & LEFT  If you have labs (blood work) drawn today and your tests are completely normal, you will receive your results only by: MyChart Message (if you have MyChart) OR A paper copy in the mail If you have any lab test that is abnormal or we need to change your treatment, we will call you to review the results.   Testing/Procedures: Your physician recommends you have a MRA Chest in November 2024   Follow-Up: At [facility], you and your health needs are our priority.  As part of our continuing mission to provide you with exceptional heart care, we have created designated Provider Care Teams.  These Care Teams include your primary Cardiologist (physician) and Advanced Practice Providers (APPs -  Physician Assistants and Nurse Practitioners) who all work together to provide you with the care you need, when you need it.  We recommend signing up for the patient portal called "MyChart".  Sign up information is provided on this After Visit Summary.  MyChart is used to connect with patients for Virtual Visits (Telemedicine).  Patients are able to view lab/test results, encounter notes, upcoming appointments, etc.  Non-urgent messages can be sent to your provider as well.   To learn more about what you can do with MyChart, go to [email or web address].    Your next appointment:   1 year(s)  Provider:   Tonny Bollman, MD  or Tereso Newcomer, PA-C         Other Instructions

## 2023-04-02 NOTE — Progress Notes (Signed)
Cardiology Office Note:    Date:  04/02/2023   ID:  Ruth Shields, Ruth Shields September 04, 1946, MRN [id number]  PCP:  Shelva Majestic, MD   [facility] Providers Cardiologist:  Tonny Bollman, MD { Referring MD: Shelva Majestic, MD   Chief Complaint  Patient presents with   Follow-up    Ascending aortic aneurysm     History of Present Illness:    Ruth Shields is a 77 y.o. female with a hx of mild nonobstructive coronary artery disease, ascending aortic aneurysm, HTN, HLD, history of PVCs.  Patient is followed by Dr. Excell Seltzer and presents today for her annual follow-up.  Per chart review, patient establish care with Dr. Excell Seltzer in 2015 after she was seen in the ED for evaluation of palpitations.  Echocardiogram on 08/17/14 showed EF 55-60%, no regional wall motion abnormalities, grade 1 diastolic dysfunction, mild AI, mild mitral valve regurgitation.  There was consideration of a Holter or event monitor, but patient refused.  Patient had been intolerant of beta-blockers in the past, and she was not started on any new medications for her palpitations. In 2018, patient had acute onset of left-sided chest pain and was seen at an outside facility. She ruled out for MI.  She had a stress echo that was reportedly abnormal. She was seen by Dr. Excell Seltzer in followup and  patient underwent left heart catheterization on 05/29/2027 that showed mild nonobstructive CAD primarily affecting the LAD.  The RCA, left main, left circumflex were all angiographically normal.  Patient was last seen by cardiology on 01/01/2022.  At that time, patient had been doing well.  She had recently taken a holiday from Crestor due to leg fatigue and discomfort.  Her symptoms did improve off Crestor.  Remained on Zetia 10 mg daily.  Today, patient states she has been doing very well from a cardiovascular standpoint since she was last seen by Dr. Excell Seltzer.  She denies shortness of breath, syncope, near syncope, dizziness.  In the past  year, she is only taken 1 sublingual nitroglycerin.  Reports that she had an episode of intense heartburn and took sublingual nitroglycerin.  This episode was very different than the chest pain she felt prior to her cardiac catheterization in 2018.  She exercises 3 times a week and walks for about 45 minutes daily.  She does not have chest pain or excessive shortness of breath on exertion.  She was last seen by her PCP in 09/2022 and LDL at that time was 85.  Patient is very concerned about her stroke risk as she ages, and would like to try a statin again.  She had muscle pain/weakness when on Crestor in the past.  She has not tried any other statins.  She has been taking over-the-counter magnesium, vitamin C, potassium supplements.  Past Medical History:  Diagnosis Date   Cancer    basal cell, squamous cell skin CA   Heart murmur    Hypertension    Premature ventricular contractions (PVCs) (VPCs)    Rectocele     Past Surgical History:  Procedure Laterality Date   BREAST BIOPSY     x2- benign   BREAST EXCISIONAL BIOPSY Right    20 years ago x 2   COLONOSCOPY     COLONOSCOPY W/ BIOPSIES AND POLYPECTOMY  2004   [place]. [place]    HERNIA REPAIR     left   KNEE ARTHROSCOPY     left and right- torn meniscus- [facility]  LEFT HEART CATH AND CORONARY ANGIOGRAPHY N/A 05/28/2017   Procedure: Left Heart Cath and Coronary Angiography;  Surgeon: Tonny Bollman, MD;  Location: [facility] INVASIVE CV LAB;  Service: Cardiovascular;  Laterality: N/A;   POLYPECTOMY     SKIN CANCER EXCISION     Dr. Emily Filbert    Current Medications: Current Meds  Medication Sig   atorvastatin (LIPITOR) 10 MG tablet Take 1 tablet (10 mg total) by mouth once a week.   calcium carbonate (OS-CAL) 1250 (500 Ca) MG chewable tablet Chew 1 tablet by mouth daily.   cholecalciferol (VITAMIN D3) 25 MCG (1000 UNIT) tablet Take 4,000 Units by mouth daily. As part of combo pill   estradiol (VIVELLE-DOT) 0.0375 MG/24HR  Place 1 patch onto the skin 2 (two) times a week.   ezetimibe (ZETIA) 10 MG tablet Take 1 tablet (10 mg total) by mouth daily. PLEASE CONTACT OUR OFFICE TO SCHEDULE AN APPOINTMENT FOR ANY FUTURE REFILLS. [phone]. THANK YOU. 1ST ATTEMPT.   Multiple Vitamins-Minerals (MULTIVITAMIN WITH MINERALS) tablet Take 1 tablet by mouth daily.   Multiple Vitamins-Minerals (PRESERVISION AREDS) TABS Take by mouth daily. Per patient taking 2 tablets daily   nitroGLYCERIN (NITROSTAT) 0.4 MG SL tablet Place 1 tablet (0.4 mg total) under the tongue every 5 (five) minutes as needed for chest pain.   progesterone (PROMETRIUM) 100 MG capsule Take 100 mg by mouth every evening.    senna (SENOKOT) 8.6 MG tablet Take 1 tablet by mouth daily.   spironolactone (ALDACTONE) 25 MG tablet TAKE 1 TABLET DAILY   valsartan-hydrochlorothiazide (DIOVAN-HCT) 80-12.5 MG tablet TAKE 1 TABLET DAILY     Allergies:   Rosuvastatin   Social History   Socioeconomic History   Marital status: Married    Spouse name: Not on file   Number of children: Not on file   Years of education: Not on file   Highest education level: Not on file  Occupational History   Not on file  Tobacco Use   Smoking status: Never   Smokeless tobacco: Never  Vaping Use   Vaping Use: Never used  Substance and Sexual Activity   Alcohol use: Yes    Alcohol/week: 7.0 standard drinks of alcohol    Types: 7 Glasses of wine per week   Drug use: No   Sexual activity: Not on file  Other Topics Concern   Not on file  Social History Narrative   Married. 2 children (40 son with 57 sons, 19 year old married- horse and dogs) in 2017.    Lives in [place].       Retired Charity fundraiser. 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      Hobbies: riding bikes, ski, yoga, workout at [employer], reading, time with mom ([place]- 3 hour drive)   Social Determinants of Health    Financial Resource Strain: Low Risk  (12/14/2021)   Overall Financial Resource Strain (CARDIA)    Difficulty of Paying Living Expenses: Not hard at all  Food Insecurity: No Food Insecurity (12/14/2021)   Hunger Vital Sign    Worried About Running Out of Food in the Last Year: Never true    Ran Out of Food in the Last Year: Never true  Transportation Needs: No Transportation Needs (12/21/2022)   PRAPARE - Administrator, Civil Service (Medical): No    Lack of Transportation (Non-Medical): No  Physical Activity: Sufficiently Active (12/14/2021)  Exercise Vital Sign    Days of Exercise per Week: 4 days    Minutes of Exercise per Session: 60 min  Stress: No Stress Concern Present (12/21/2022)   [employer] of Occupational Health - Occupational Stress Questionnaire    Feeling of Stress : Not at all  Social Connections: Unknown (12/17/2022)   Social Connection and Isolation Panel [NHANES]    Frequency of Communication with Friends and Family: Not on file    Frequency of Social Gatherings with Friends and Family: Not on file    Attends Religious Services: More than 4 times per year    Active Member of [employer] or Organizations: Not on file    Attends Banker Meetings: Not on file    Marital Status: Married     Family History: The patient's family history includes Atrial fibrillation in her mother; Cancer in her father; Healthy in her brother; Hypertension in her father; Other in her brother; Parkinson's disease in her father; Stroke in her mother. There is no history of Colon cancer, Stomach cancer, Rectal cancer, or Esophageal cancer.  ROS:   Please see the history of present illness.     All other systems reviewed and are negative.  EKGs/Labs/Other Studies Reviewed:    The following studies were reviewed today: Cardiac Studies & Procedures   CARDIAC CATHETERIZATION  CARDIAC CATHETERIZATION 05/28/2017  Narrative 1. Mild nonobstructive CAD, primarily  affecting the LAD 2. Angiographically normal RCA, left main, and LCx 3. Normal LVEDP 4. Normal LV systolic function by echo assessment  Suspect noncardiac chest pain. Low-dose statin reasonable consideration for risk reduction if tolerated.  Findings Coronary Findings Diagnostic  Dominance: Right  Left Anterior Descending The LAD his mild irregularity with mild plaquing in the mid and distal vessel. There are no areas of high-grade coronary obstruction. The vessel reaches the LV apex.  Second Diagonal Branch  Left Circumflex Vessel is angiographically normal.  Right Coronary Artery Vessel was injected. Vessel is large. Vessel is angiographically normal. Large, dominant vessel. Angiographically normal.  Intervention  No interventions have been documented.                 EKG:  EKG is ordered today.  The ekg ordered today demonstrates normal sinus rhythm, HR 69 BPM. No changes compared to previous EKG from 12/2021.   Recent Labs: 09/13/2022: ALT 15; BUN 18; Creatinine, Ser 0.86; Hemoglobin 12.4; Platelets 234.0; Potassium 3.5; Sodium 135  Recent Lipid Panel    Component Value Date/Time   CHOL 175 09/13/2022 1346   CHOL 181 04/09/2022 0909   TRIG 51.0 09/13/2022 1346   HDL 79.50 09/13/2022 1346   HDL 72 04/09/2022 0909   CHOLHDL 2 09/13/2022 1346   VLDL 10.2 09/13/2022 1346   LDLCALC 85 09/13/2022 1346   LDLCALC 97 04/09/2022 0909     Risk Assessment/Calculations:                Physical Exam:    VS:  BP 110/62   Pulse 69   Ht 5' 4.5" (1.638 m)   Wt 147 lb 9.6 oz (67 kg)   SpO2 98%   BMI 24.94 kg/m     Wt Readings from Last 3 Encounters:  04/02/23 147 lb 9.6 oz (67 kg)  12/21/22 145 lb (65.8 kg)  09/13/22 147 lb 3.2 oz (66.8 kg)     GEN:  Well nourished, well developed in no acute distress. Sitting comfortably on the exam table  HEENT: Normal NECK: No JVD; No  carotid bruits CARDIAC: RRR, no murmurs, rubs, gallops. Radial pulses 2+ bilaterally   RESPIRATORY:  Clear to auscultation without rales, wheezing or rhonchi. Normal work of breathing on room air   ABDOMEN: Soft, non-tender, non-distended MUSCULOSKELETAL:  No edema in BLE; No deformity  SKIN: Warm and dry NEUROLOGIC:  Alert and oriented x 3 PSYCHIATRIC:  Normal affect   ASSESSMENT:    1. Aneurysm of ascending aorta without rupture   2. Coronary artery disease involving native coronary artery of native heart without angina pectoris   3. Hyperlipidemia, unspecified hyperlipidemia type   4. Essential hypertension    PLAN:    In order of problems listed above:  Dilation of ascending aorta - Most recent MRA chest from 10/24/2021 showed a stable fusiform aneurysmal dilation of the ascending thoracic aorta measuring 4.3 cm - Patient will be due for repeat MRA in 10/2023.  Order placed today - BP well-controlled  Mild, nonobstructive CAD -Patient had a cardiac catheterization in 2018 that showed mild nonobstructive CAD primarily affecting the LAD.  RCA, left main, left circumflex were all angiographically normal - She denies chest pain or shortness of breath. She exercises multiple days a week and does not have chest pain on exertion  - Continue zetia for cardiovascular risk reduction. Patient would like to retry statin therapy. In the past she had leg pain and weakness with crestor. Starting Lipitor 10 mg once weekly.   Hyperlipidemia -Lipid panel from 09/2022 showed LDL 85, HDL 79.5, triglycerides 51, total cholesterol 175  - Patient intolerant of crestor (developed leg pain). However, she would like to try to add on a statin again. Start lipitor 10 mg once weekly. If patient tolerates it, she can increase to twice weekly  - Continue zetia 10 mg daily.  - Ordered repeat LFTs and lipid panel to be drawn in 3 months   Hypertension - BP well controlled on spironolactone 25 mg daily and valsartan-hydrochlorothiazide 80-12.5 mg daily  - CMP from 09/2022 showed K 3.5,  creatinine 0.86  - Patient reports that she started taking OTC potassium and magnesium supplementations since having labs drawn last. Ordered CMP and mag levels today   Medication Adjustments/Labs and Tests Ordered: Current medicines are reviewed at length with the patient today.  Concerns regarding medicines are outlined above.  Orders Placed This Encounter  Procedures   MR Angiogram Chest W Wo Contrast   Lipid panel   Hepatic function panel   Comp Met (CMET)   Magnesium   EKG 12-Lead   Meds ordered this encounter  Medications   atorvastatin (LIPITOR) 10 MG tablet    Sig: Take 1 tablet (10 mg total) by mouth once a week.    Dispense:  16 tablet    Refill:  3    Patient Instructions  Medication Instructions:  Your physician has recommended you make the following change in your medication:   START Atorvastatin 10 mg taking 1 tablet weekly  *If you need a refill on your cardiac medications before your next appointment, please call your pharmacy*   Lab Work: TODAY:  CMET & MAG  3 MONTHS:  GO TO [facility], FASTING, FOR:  LIPID & LEFT  If you have labs (blood work) drawn today and your tests are completely normal, you will receive your results only by: MyChart Message (if you have MyChart) OR A paper copy in the mail If you have any lab test that is abnormal or we need to change your treatment, we will call you to  review the results.   Testing/Procedures: Your physician recommends you have a MRA Chest in November 2024   Follow-Up: At [facility], you and your health needs are our priority.  As part of our continuing mission to provide you with exceptional heart care, we have created designated Provider Care Teams.  These Care Teams include your primary Cardiologist (physician) and Advanced Practice Providers (APPs -  Physician Assistants and Nurse Practitioners) who all work together to provide you with the care you need, when you need it.  We recommend signing up  for the patient portal called "MyChart".  Sign up information is provided on this After Visit Summary.  MyChart is used to connect with patients for Virtual Visits (Telemedicine).  Patients are able to view lab/test results, encounter notes, upcoming appointments, etc.  Non-urgent messages can be sent to your provider as well.   To learn more about what you can do with MyChart, go to [email or web address].    Your next appointment:   1 year(s)  Provider:   Tonny Bollman, MD  or Tereso Newcomer, PA-C         Other Instructions    Signed, Jonita Albee, PA-C  04/02/2023 3:11 PM    [facility]

## 2023-04-03 LAB — COMPREHENSIVE METABOLIC PANEL
ALT: 15 IU/L (ref 0–32)
AST: 24 IU/L (ref 0–40)
Albumin/Globulin Ratio: 2 (ref 1.2–2.2)
Albumin: 4.2 g/dL (ref 3.8–4.8)
Alkaline Phosphatase: 46 IU/L (ref 44–121)
BUN/Creatinine Ratio: 16 (ref 12–28)
BUN: 14 mg/dL (ref 8–27)
Bilirubin Total: 0.2 mg/dL (ref 0.0–1.2)
CO2: 23 mmol/L (ref 20–29)
Calcium: 9.5 mg/dL (ref 8.7–10.3)
Chloride: 99 mmol/L (ref 96–106)
Creatinine, Ser: 0.9 mg/dL (ref 0.57–1.00)
Globulin, Total: 2.1 g/dL (ref 1.5–4.5)
Glucose: 83 mg/dL (ref 70–99)
Potassium: 4 mmol/L (ref 3.5–5.2)
Sodium: 136 mmol/L (ref 134–144)
Total Protein: 6.3 g/dL (ref 6.0–8.5)
eGFR: 67 mL/min/{1.73_m2} (ref >59)

## 2023-04-03 LAB — MAGNESIUM: Magnesium: 2.2 mg/dL (ref 1.6–2.3)

## 2023-04-11 ENCOUNTER — Other Ambulatory Visit: Payer: Self-pay

## 2023-04-11 MED ORDER — NITROGLYCERIN 0.4 MG SL SUBL: 0.4000 mg | SUBLINGUAL_TABLET | SUBLINGUAL | 2 refills | Status: AC | PRN

## 2023-05-27 NOTE — Telephone Encounter (Signed)
Jonita Albee, PA-C  [facility] [place] Triage2 days ago    OK to cancel labs since she is not taking the medication anymore  Thanks KJ   Patient aware, med list updated. Labs cancelled.

## 2023-08-15 ENCOUNTER — Other Ambulatory Visit: Payer: Self-pay | Admitting: Family Medicine

## 2023-09-19 ENCOUNTER — Ambulatory Visit: Payer: Medicare Other | Admitting: Family Medicine

## 2023-10-11 ENCOUNTER — Encounter: Payer: Self-pay | Admitting: Family Medicine

## 2023-10-11 ENCOUNTER — Ambulatory Visit (INDEPENDENT_AMBULATORY_CARE_PROVIDER_SITE_OTHER): Payer: Medicare Other | Admitting: Family Medicine

## 2023-10-11 VITALS — BP 120/70 | HR 74 | Temp 97.2°F | Ht 64.5 in | Wt 141.2 lb

## 2023-10-11 DIAGNOSIS — E785 Hyperlipidemia, unspecified: Secondary | ICD-10-CM | POA: Diagnosis not present

## 2023-10-11 DIAGNOSIS — I7121 Aneurysm of the ascending aorta, without rupture: Secondary | ICD-10-CM

## 2023-10-11 DIAGNOSIS — I10 Essential (primary) hypertension: Secondary | ICD-10-CM

## 2023-10-11 DIAGNOSIS — I2510 Atherosclerotic heart disease of native coronary artery without angina pectoris: Secondary | ICD-10-CM | POA: Diagnosis not present

## 2023-10-11 DIAGNOSIS — M810 Age-related osteoporosis without current pathological fracture: Secondary | ICD-10-CM

## 2023-10-11 DIAGNOSIS — G720 Drug-induced myopathy: Secondary | ICD-10-CM

## 2023-10-11 NOTE — Progress Notes (Signed)
Phone [phone] In person visit   Subjective:   Ruth Shields is a 77 y.o. year old very pleasant female patient who presents for/with See problem oriented charting Chief Complaint  Patient presents with   Annual Exam    Fasting.   Hypertension    Past Medical History-  Patient Active Problem List   Diagnosis Date Noted   Nonobstructive CAD per cath 2018-Dr. Cooper 03/19/2019    Priority: High   Aneurysm of ascending aorta (HCC) 05/23/2017    Priority: High   Hyperlipidemia 11/13/2017    Priority: Medium    Osteoporosis 11/12/2016    Priority: Medium    Hormone replacement therapy 11/12/2016    Priority: Medium    Essential hypertension 03/21/2010    Priority: Medium    GERD (gastroesophageal reflux disease) 11/12/2016    Priority: Low   Palpitations 07/31/2014    Priority: Low   MURMUR 03/21/2010    Priority: Low   Abnormal stress echo 05/28/2017    Medications- reviewed and updated Current Outpatient Medications  Medication Sig Dispense Refill   calcium carbonate (OS-CAL) 1250 (500 Ca) MG chewable tablet Chew 1 tablet by mouth daily.     cholecalciferol (VITAMIN D3) 25 MCG (1000 UNIT) tablet Take 4,000 Units by mouth daily. As part of combo pill     estradiol (VIVELLE-DOT) 0.0375 MG/24HR Place 1 patch onto the skin 2 (two) times a week.     ezetimibe (ZETIA) 10 MG tablet Take 1 tablet (10 mg total) by mouth daily. 90 tablet 3   Multiple Vitamins-Minerals (MULTIVITAMIN WITH MINERALS) tablet Take 1 tablet by mouth daily.     Multiple Vitamins-Minerals (PRESERVISION AREDS) TABS Take by mouth daily. Per patient taking 2 tablets daily     nitroGLYCERIN (NITROSTAT) 0.4 MG SL tablet Place 1 tablet (0.4 mg total) under the tongue every 5 (five) minutes as needed for chest pain. 75 tablet 2   progesterone (PROMETRIUM) 100 MG capsule Take 100 mg by mouth every evening.      senna (SENOKOT) 8.6 MG tablet Take 1 tablet by mouth daily.     spironolactone (ALDACTONE) 25 MG  tablet TAKE 1 TABLET DAILY 90 tablet 0   valsartan-hydrochlorothiazide (DIOVAN-HCT) 80-12.5 MG tablet TAKE 1 TABLET DAILY 90 tablet 0   No current facility-administered medications for this visit.     Objective:  BP 120/70   Pulse 74   Temp (!) 97.2 F (36.2 C)   Ht 5' 4.5" (1.638 m)   Wt 141 lb 3.2 oz (64 kg)   BMI 23.86 kg/m  Gen: NAD, resting comfortably CV: RRR no murmurs rubs or gallops Lungs: CTAB no crackles, wheeze, rhonchi Abdomen: soft/nontender/nondistended/normal bowel sounds. No rebound or guarding.  Ext: no edema Skin: warm, dry Neuro: grossly normal, moves all extremities     Assessment and Plan   #social update- has moved to [place] and 57 [place] - already set up with PCP in [facility] for annual visit 1.  Colon cancer screening-Dr. Russella Dar colonoscopy 06/21/22- repeat colonoscopy in 5 to 10 years-not yet due 2.  Breast cancer screening-12/13/22 up to date   3. Derm associates for skin cancer screening- Dr. Sabino Niemann of skin cancer  4. Cervical cancer screening- Past age based screening recommendations butstill sees GYN 5.   Immunizations-already had flu shot this year, opts out of COVID-19 vaccination and otherwise up-to-date . Had COVID about 2 months ago and holding off on immunization as a result.  Immunization History  Administered  Date(s) Administered   Fluad Quad(high Dose 65+) 08/17/2021, 09/13/2022   Influenza, High Dose Seasonal PF 08/25/2023   Influenza-Unspecified 08/10/2014, 09/08/2014, 10/04/2015, 09/26/2016, 09/25/2017, 09/28/2019, 09/28/2020   Moderna SARS-COV2 Booster Vaccination 10/07/2020, 07/10/2021   Moderna Sars-Covid-2 Vaccination 12/17/2019, 01/14/2020   PFIZER Comirnaty(Gray Top)Covid-19 Tri-Sucrose Vaccine 09/06/2022   Pneumococcal Conjugate-13 04/28/2014   Pneumococcal Polysaccharide-23 05/19/2020   RSV,unspecified 11/11/2022   Td 12/10/2008, 03/24/2019   Tdap 04/14/2019   Zoster Recombinant(Shingrix)  09/03/2017, 02/21/2018   #hypertension S: medication: Spironolactone 25 mg, valsartan hydrochlorothiazide 80-12.5 mg Home readings #s: Sparingly checks BP Readings from Last 3 Encounters:  10/11/23 120/70  04/02/23 110/62  09/13/22 100/64  A/P: Well-controlled-continue current medication   #hyperlipidemia-mild nonobstructive primarily affecting the LAD based on catheterization with Dr. Excell Seltzer in 2018 but statin intolerant even with rosuvastatin 5 mg twice a week due to myalgias S: Medication:Zetia 10 mg. Retry this year of atorvastatin with similar symptoms and even worse -no chest pain or shortness of breath   -doing functional fitness workouts- feels great with this. Lab Results  Component Value Date   CHOL 175 09/13/2022   HDL 79.50 09/13/2022   LDLCALC 85 09/13/2022   TRIG 51.0 09/13/2022   CHOLHDL 2 09/13/2022   A/P: Tremendous improvement in LDL last year-hopefully remained stable since she cannot tolerate statins- appreciate she gave this another effort this year -shed be interested in injectables if covered like repatha  CAD asymptomatic-continue current medication-did not tolerate aspirin due to bruising- bruises even without it    # GERD-sparing Pepcid is still helpful but rare   # Osteoporosis S: Last DEXA: 02/26/2022 with Dr. Salomon Mast density was normal despite prior low numbers and need for Fosamax and weightbearing exercise in the past  Medication (bisphosphonate or prolia): none at present  Calcium: 1200mg  (through diet ok) recommended - through diet plus a pill Vitamin D: 1000 units a day recommended-takes 5000 units a day  Last vitamin D Lab Results  Component Value Date   VD25OH 72.06 09/13/2022  A/P: osteoporosis had improved on last check- does great with calcium, vitamin D, weight bearing and improved to normal range. Update D with ongoing high dose vitamin D 5000 units a day   # Insomnia-melatonin generally helpful and also takes magnesium- going to  sleep easily   Ascending aortic aneurysm S: Patient incidental finding during chest pain work-up May 21, 2017 at 4.2 cm.  Continues to follow close with Dr. Excell Seltzer.  Most recent imaging showed stable aneurysm 4.3 cm on 10/24/2021 A/P: Cardiology is monitoring every other year-was already ordered and planned for November through cardiology  # Hormone replacement therapy-prefers to continue with GYN   Recommended follow up: moving- will establish with new PCP  Lab/Order associations: fasting   ICD-10-CM   1. Coronary artery disease involving native coronary artery of native heart without angina pectoris  I25.10 Comprehensive metabolic panel    CBC with Differential/Platelet    Lipid panel    2. Ascending aortic aneurysm, unspecified whether ruptured (HCC)  I71.21     3. Hyperlipidemia, unspecified hyperlipidemia type  E78.5     4. 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 Age-related osteoporosis without current pathological fracture  M81.0 VITAMIN D 25 Hydroxy (Vit-D Deficiency, Fractures)      No orders of the defined types were placed in this encounter.  Return precautions advised.  Tana Conch, MD

## 2023-10-11 NOTE — Patient Instructions (Addendum)
Please stop by lab before you go If you have mychart- we will send your results within 3 business days of [place] receiving them.  If you do not have mychart- we will call you about results within 5 business days of [place] receiving them.  *please also note that you will see labs on mychart as soon as they post. I will later go in and write notes on them- will say "notes from Dr. Durene Cal"   We will miss you and your husband! Thanks for the honor of being your physician!   Recommended follow up: we will miss you! So glad you have been so intentional and already signed up with PCP close to you

## 2023-10-11 NOTE — Addendum Note (Signed)
Addended by: Lorn Junes on: 10/11/2023 03:37 PM   Modules accepted: Orders

## 2023-10-12 LAB — URINALYSIS, ROUTINE W REFLEX MICROSCOPIC
Bacteria, UA: NONE SEEN /[HPF]
Bilirubin Urine: NEGATIVE
Glucose, UA: NEGATIVE
Hgb urine dipstick: NEGATIVE
Hyaline Cast: NONE SEEN /[LPF]
Nitrite: NEGATIVE
Protein, ur: NEGATIVE
Specific Gravity, Urine: 1.019 (ref 1.001–1.035)
pH: 6 (ref 5.0–8.0)

## 2023-10-12 LAB — COMPREHENSIVE METABOLIC PANEL
AG Ratio: 1.8 (calc) (ref 1.0–2.5)
ALT: 15 U/L (ref 6–29)
AST: 29 U/L (ref 10–35)
Albumin: 4.1 g/dL (ref 3.6–5.1)
Alkaline phosphatase (APISO): 39 U/L (ref 37–153)
BUN: 14 mg/dL (ref 7–25)
CO2: 28 mmol/L (ref 20–32)
Calcium: 9.4 mg/dL (ref 8.6–10.4)
Chloride: 100 mmol/L (ref 98–110)
Creat: 0.88 mg/dL (ref 0.60–1.00)
Globulin: 2.3 g/dL (ref 1.9–3.7)
Glucose, Bld: 78 mg/dL (ref 65–99)
Potassium: 3.7 mmol/L (ref 3.5–5.3)
Sodium: 137 mmol/L (ref 135–146)
Total Bilirubin: 0.6 mg/dL (ref 0.2–1.2)
Total Protein: 6.4 g/dL (ref 6.1–8.1)

## 2023-10-12 LAB — LIPID PANEL
Cholesterol: 165 mg/dL (ref <200)
HDL: 88 mg/dL (ref >=50)
LDL Cholesterol (Calc): 64 mg/dL
Non-HDL Cholesterol (Calc): 77 mg/dL (ref <130)
Total CHOL/HDL Ratio: 1.9 (calc) (ref <5.0)
Triglycerides: 47 mg/dL (ref <150)

## 2023-10-12 LAB — MICROSCOPIC MESSAGE

## 2023-10-12 LAB — VITAMIN D 25 HYDROXY (VIT D DEFICIENCY, FRACTURES): Vit D, 25-Hydroxy: 75 ng/mL (ref 30–100)

## 2023-10-13 ENCOUNTER — Encounter: Payer: Self-pay | Admitting: Cardiovascular Disease

## 2023-10-30 ENCOUNTER — Ambulatory Visit (HOSPITAL_COMMUNITY)
Admission: RE | Admit: 2023-10-30 | Discharge: 2023-10-30 | Disposition: A | Discharge status: Home / Self Care | Payer: Medicare Other | Source: Ambulatory Visit | Attending: Cardiology | Admitting: Cardiology

## 2023-10-30 DIAGNOSIS — I7121 Aneurysm of the ascending aorta, without rupture: Secondary | ICD-10-CM | POA: Diagnosis present

## 2023-10-30 DIAGNOSIS — I10 Essential (primary) hypertension: Secondary | ICD-10-CM | POA: Insufficient documentation

## 2023-10-30 DIAGNOSIS — E785 Hyperlipidemia, unspecified: Secondary | ICD-10-CM | POA: Insufficient documentation

## 2023-10-30 DIAGNOSIS — I2510 Atherosclerotic heart disease of native coronary artery without angina pectoris: Secondary | ICD-10-CM | POA: Diagnosis present

## 2023-10-30 MED ORDER — GADOBUTROL 1 MMOL/ML IV SOLN
6.0000 mL | Freq: Once | INTRAVENOUS | Status: AC | PRN
  Administered 2023-10-30: 6 mL via INTRAVENOUS

## 2023-10-31 ENCOUNTER — Other Ambulatory Visit: Payer: Self-pay | Admitting: Obstetrics and Gynecology

## 2023-10-31 DIAGNOSIS — Z1231 Encounter for screening mammogram for malignant neoplasm of breast: Secondary | ICD-10-CM

## 2023-11-04 ENCOUNTER — Telehealth: Payer: Self-pay

## 2023-11-04 NOTE — Telephone Encounter (Signed)
Called patient advised of below they verbalized understanding.

## 2023-11-04 NOTE — Telephone Encounter (Signed)
-----   Message from Jonita Albee sent at 11/04/2023  8:06 AM EST ----- Please tell patient that her chest MRA showed stable dilation of the ascending thoracic aorta measuring 42 mm. No other significant findings on chest MRA. Continue to monitor on a 2 year basis   Thanks KJ

## 2023-11-10 ENCOUNTER — Encounter: Payer: Self-pay | Admitting: Family Medicine

## 2023-11-13 ENCOUNTER — Other Ambulatory Visit: Payer: Self-pay | Admitting: Family Medicine

## 2023-11-26 ENCOUNTER — Encounter: Payer: Self-pay | Admitting: Family Medicine

## 2023-11-26 ENCOUNTER — Ambulatory Visit: Payer: Medicare Other | Admitting: Family Medicine

## 2023-11-26 VITALS — BP 108/68 | HR 76 | Temp 97.6°F | Ht 64.5 in | Wt 148.6 lb

## 2023-11-26 DIAGNOSIS — E785 Hyperlipidemia, unspecified: Secondary | ICD-10-CM | POA: Diagnosis not present

## 2023-11-26 DIAGNOSIS — I2510 Atherosclerotic heart disease of native coronary artery without angina pectoris: Secondary | ICD-10-CM | POA: Diagnosis not present

## 2023-11-26 DIAGNOSIS — R1012 Left upper quadrant pain: Secondary | ICD-10-CM

## 2023-11-26 DIAGNOSIS — G720 Drug-induced myopathy: Secondary | ICD-10-CM

## 2023-11-26 DIAGNOSIS — I10 Essential (primary) hypertension: Secondary | ICD-10-CM

## 2023-11-26 NOTE — Progress Notes (Signed)
Phone [phone] In person visit   Subjective:   Ruth Shields is a 77 y.o. year old very pleasant female patient who presents for/with See problem oriented charting Chief Complaint  Patient presents with   rib pain    Pt c/o rib cage pain that's getting worse (see mychart).   Past Medical History-  Patient Active Problem List   Diagnosis Date Noted   Nonobstructive CAD per cath 2018-Dr. Cooper 03/19/2019    Priority: High   Aneurysm of ascending aorta (HCC) 05/23/2017    Priority: High   Hyperlipidemia 11/13/2017    Priority: Medium    Osteoporosis 11/12/2016    Priority: Medium    Hormone replacement therapy 11/12/2016    Priority: Medium    Essential hypertension 03/21/2010    Priority: Medium    GERD (gastroesophageal reflux disease) 11/12/2016    Priority: Low   Palpitations 07/31/2014    Priority: Low   MURMUR 03/21/2010    Priority: Low   Abnormal stress echo 05/28/2017    Medications- reviewed and updated Current Outpatient Medications  Medication Sig Dispense Refill   calcium carbonate (OS-CAL) 1250 (500 Ca) MG chewable tablet Chew 1 tablet by mouth daily.     cholecalciferol (VITAMIN D3) 25 MCG (1000 UNIT) tablet Take 4,000 Units by mouth daily. As part of combo pill     estradiol (VIVELLE-DOT) 0.0375 MG/24HR Place 1 patch onto the skin 2 (two) times a week.     ezetimibe (ZETIA) 10 MG tablet Take 1 tablet (10 mg total) by mouth daily. 90 tablet 3   Multiple Vitamins-Minerals (MULTIVITAMIN WITH MINERALS) tablet Take 1 tablet by mouth daily.     Multiple Vitamins-Minerals (PRESERVISION AREDS) TABS Take by mouth daily. Per patient taking 2 tablets daily     nitroGLYCERIN (NITROSTAT) 0.4 MG SL tablet Place 1 tablet (0.4 mg total) under the tongue every 5 (five) minutes as needed for chest pain. 75 tablet 2   progesterone (PROMETRIUM) 100 MG capsule Take 100 mg by mouth every evening.      senna (SENOKOT) 8.6 MG tablet Take 1 tablet by mouth daily.      spironolactone (ALDACTONE) 25 MG tablet TAKE 1 TABLET DAILY 90 tablet 3   valsartan-hydrochlorothiazide (DIOVAN-HCT) 80-12.5 MG tablet TAKE 1 TABLET DAILY 90 tablet 3   No current facility-administered medications for this visit.     Objective:  BP 108/68   Pulse 76   Temp 97.6 F (36.4 C)   Ht 5' 4.5" (1.638 m)   Wt 148 lb 9.6 oz (67.4 kg)   SpO2 96%   BMI 25.11 kg/m  Gen: NAD, resting comfortably CV: RRR no murmurs rubs or gallops Lungs: CTAB no crackles, wheeze, rhonchi Abdomen: soft/nontender/nondistended/normal bowel sounds. No rebound or guarding.  Ext: no edema Skin: warm, dry     Assessment and Plan   # Social Frann Rider has moved out of town but wanted to schedule follow-up with [place] here and is willing to travel back here for imaging  # Left upper quadrant pain S:Burning pain on left upper abdomen just below her ribs. Has had this for quite some time perhaps at least to 2018. Used to be only with standing for prolonged periods like baking in kitchen over holidays. She has brought this up on last two colonoscopies and reported gastroenterology thought it was more muscular.  - since moving though she has noted an increase in the frequency of the pain. She has to lie down or sit down to make  the pain go away. Her husband who is a physician is concerned about hernia but they cannot feel a bulge.   -she does have known thoracic aneurysm but that was stbale on MRI 10/30/23  She attempted after reaching out to [place] to see how she would do with walking- for 3 consecutive days got worse with walking but if she pressed on the area had substantial improvement in pain and could finish the walk. Not worse with eating. Normal bowel movements lately. Has bothered with pilates core work as well. No fever/chills/nausea/vomiting.   Does get reflux but usually left upper chest and sharper- Nexium has helped with that in past- not needing lately.   Had catheterization 05/28/2017 with Dr.  Excell Seltzer that showed mild nonobstructive CAD primarily the LAD  CMP and UA within 6 weeks- hold off on repeat labs.  A/P: Left upper quadrant pain (unable to reproduce on exam today) worse with standing and sometimes exertion since at least 2018 that seems to be worsening in the last 2 months.  Has had prior cardiac workup largely reassuring with mild nonobstructive CAD.  I offered updated EKG the patient wanted to hold off as she will be seeing cardiology early next year. -Reassuring colonoscopies during this timeframe -Opted for CT abdomen pelvis to rule out hernia or mass -Reassuring that thoracic aneurysm was stable on recent check  #hypertension S: medication: spironolactone 25 mg, valsartan hydrochlorothiazide 80-12.5 mg  BP Readings from Last 3 Encounters:  11/26/23 108/68  10/11/23 120/70  04/02/23 110/62  A/P: Initial blood pressure rather low but improved on repeat-continue current medication  #hyperlipidemia S: Medication: Zetia 10 mg.  Statin myalgias on even twice weekly rosuvastatin 5 mg and atorvastatin trial Lab Results  Component Value Date   CHOL 165 10/11/2023   HDL 88 10/11/2023   LDLCALC 64 10/11/2023   TRIG 47 10/11/2023   CHOLHDL 1.9 10/11/2023   A/P: Lipids reasonably well-controlled despite not tolerating statin-continue Zetia   Recommended follow up: Return for as needed for new, worsening, persistent symptoms. Future Appointments  Date Time Provider Department Center  12/20/2023 11:00 AM [facility] MM 2 [facility]  01/14/2024 11:20 AM [facility] ANNUAL WELLNESS VISIT 1 [facility]    Lab/Order associations:   ICD-10-CM   1. Left upper quadrant pain  R10.12 CT ABDOMEN PELVIS W CONTRAST    2. Essential hypertension  I10     3. Hyperlipidemia, unspecified hyperlipidemia type  E78.5     4. Coronary artery disease involving native coronary artery of native heart without angina pectoris  I25.10     5. Drug-induced myopathy  G72.0       No orders  of the defined types were placed in this encounter.   Return precautions advised.  Tana Conch, MD

## 2023-11-26 NOTE — Patient Instructions (Addendum)
We will call you within two weeks about your referral to CT abdomen/pelvis through [facility]- evaluate for a hernia we can't feel or abdominal mass to be on safe side.  Their phone number is [phone].  Please call them if you have not heard in 1-2 weeks  We also discussed possible EKG but you wanted to chat with Dr. Excell Seltzer at your next visit  Recommended follow up: Return for as needed for new, worsening, persistent symptoms. Definitely if worsens or you note other issues please let us know as soon as possible

## 2023-12-20 ENCOUNTER — Ambulatory Visit
Admission: RE | Admit: 2023-12-20 | Discharge: 2023-12-20 | Disposition: A | Discharge status: Home / Self Care | Payer: Medicare Other | Source: Ambulatory Visit | Attending: Obstetrics and Gynecology | Admitting: Obstetrics and Gynecology

## 2023-12-20 ENCOUNTER — Ambulatory Visit
Admission: RE | Admit: 2023-12-20 | Discharge: 2023-12-20 | Disposition: A | Discharge status: Home / Self Care | Payer: Medicare Other | Source: Ambulatory Visit | Attending: Family Medicine | Admitting: Family Medicine

## 2023-12-20 DIAGNOSIS — Z1231 Encounter for screening mammogram for malignant neoplasm of breast: Secondary | ICD-10-CM

## 2023-12-20 DIAGNOSIS — R1012 Left upper quadrant pain: Secondary | ICD-10-CM

## 2023-12-20 MED ORDER — IOPAMIDOL (ISOVUE-300) INJECTION 61%
500.0000 mL | Freq: Once | INTRAVENOUS | Status: AC | PRN
Start: 2023-12-20 — End: 2023-12-20
  Administered 2023-12-20: 100 mL via INTRAVENOUS

## 2023-12-24 ENCOUNTER — Encounter: Payer: Self-pay | Admitting: Family Medicine

## 2024-01-14 ENCOUNTER — Ambulatory Visit (INDEPENDENT_AMBULATORY_CARE_PROVIDER_SITE_OTHER): Payer: Medicare Other

## 2024-01-14 VITALS — Wt 140.0 lb

## 2024-01-14 DIAGNOSIS — Z0000 Encounter for general adult medical examination without abnormal findings: Secondary | ICD-10-CM | POA: Diagnosis not present

## 2024-01-14 NOTE — Patient Instructions (Addendum)
 Ruth Shields , Thank you for taking time to come for your Medicare Wellness Visit. I appreciate your ongoing commitment to your health goals. Please review the following plan we discussed and let me know if I can assist you in the future.   Referrals/Orders/Follow-Ups/Clinician Recommendations: maintain health and activity   This is a list of the screening recommended for you and due dates:  Health Maintenance  Topic Date Due   COVID-19 Vaccine (4 - 2024-25 season) 08/11/2023   Medicare Annual Wellness Visit  01/13/2025   Colon Cancer Screening  06/22/2027   DTaP/Tdap/Td vaccine (4 - Td or Tdap) 04/13/2029   Pneumonia Vaccine  Completed   Flu Shot  Completed   DEXA scan (bone density measurement)  Completed   Zoster (Shingles) Vaccine  Completed   Hepatitis C Screening  Addressed   HPV Vaccine  Aged Out    Advanced directives: (Copy Requested) Please bring a copy of your health care power of attorney and living will to the office to be added to your chart at your convenience.  Next Medicare Annual Wellness Visit scheduled for next year: Yes

## 2024-01-14 NOTE — Progress Notes (Signed)
 Subjective:   Ruth Shields is a 78 y.o. female who presents for Medicare Annual (Subsequent) preventive examination.  Visit Complete: audio telephonic  connected with  MICHON KACZMAREK on 01/14/24 by a video and audio enabled telemedicine application and verified that I am speaking with the correct person using two identifiers.  Patient Location: Home  Provider Location: Office/Clinic  I discussed the limitations of evaluation and management by telemedicine. The patient expressed understanding and agreed to proceed.  Vital Signs: Because this visit was a virtual/telehealth visit, some criteria may be missing or patient reported. Any vitals not documented were not able to be obtained and vitals that have been documented are patient reported.  Patient Medicare AWV questionnaire was completed by the patient on 01/12/24; I have confirmed that all information answered by patient is correct and no changes since this date.  Cardiac Risk Factors include: advanced age (>67men, >47 women);dyslipidemia;hypertension     Objective:    Today's Vitals   01/14/24 1109  Weight: 140 lb (63.5 kg)   Body mass index is 23.66 kg/m.     01/14/2024   11:13 AM 12/21/2022   11:38 AM 12/14/2021    9:36 AM 05/28/2017    9:28 AM 05/10/2017    9:36 AM 04/24/2017    1:43 PM  Advanced Directives  Does Patient Have a Medical Advance Directive? Yes Yes Yes Yes Yes Yes  Type of Estate Agent of [place];Living will Healthcare Power of [place];Living will Healthcare Power of [place] of [place];Living will  Healthcare Power of [place];Living will  Does patient want to make changes to medical advance directive?    No - Patient declined    Copy of Healthcare Power of Attorney in Chart? No - copy requested No - copy requested No - copy requested No - copy requested      Current Medications (verified) Outpatient Encounter Medications as of 01/14/2024  Medication Sig   calcium   carbonate (OS-CAL) 1250 (500 Ca) MG chewable tablet Chew 1 tablet by mouth daily.   cholecalciferol (VITAMIN D3) 25 MCG (1000 UNIT) tablet Take 4,000 Units by mouth daily. As part of combo pill   estradiol (VIVELLE-DOT) 0.0375 MG/24HR Place 1 patch onto the skin 2 (two) times a week.   ezetimibe  (ZETIA ) 10 MG tablet Take 1 tablet (10 mg total) by mouth daily.   Multiple Vitamins-Minerals (MULTIVITAMIN WITH MINERALS) tablet Take 1 tablet by mouth daily.   Multiple Vitamins-Minerals (PRESERVISION AREDS) TABS Take by mouth daily. Per patient taking 2 tablets daily   nitroGLYCERIN  (NITROSTAT ) 0.4 MG SL tablet Place 1 tablet (0.4 mg total) under the tongue every 5 (five) minutes as needed for chest pain.   progesterone (PROMETRIUM) 100 MG capsule Take 100 mg by mouth every evening.    senna (SENOKOT) 8.6 MG tablet Take 1 tablet by mouth daily.   spironolactone  (ALDACTONE ) 25 MG tablet TAKE 1 TABLET DAILY   valsartan -hydrochlorothiazide  (DIOVAN -HCT) 80-12.5 MG tablet TAKE 1 TABLET DAILY   No facility-administered encounter medications on file as of 01/14/2024.    Allergies (verified) Rosuvastatin    History: Past Medical History:  Diagnosis Date   Cancer (HCC)    basal cell, squamous cell skin CA   Heart murmur    Hypertension    Premature ventricular contractions (PVCs) (VPCs)    Rectocele    Past Surgical History:  Procedure Laterality Date   BREAST BIOPSY     x2- benign   BREAST EXCISIONAL BIOPSY Right  20 years ago x 2   COLONOSCOPY     COLONOSCOPY W/ BIOPSIES AND POLYPECTOMY  2004   [place]    HERNIA REPAIR     left   KNEE ARTHROSCOPY     left and right- torn meniscus- [facility]   LEFT HEART CATH AND CORONARY ANGIOGRAPHY N/A 05/28/2017   Procedure: Left Heart Cath and Coronary Angiography;  Surgeon: Wonda Sharper, MD;  Location: [facility] INVASIVE CV LAB;  Service: Cardiovascular;  Laterality: N/A;   POLYPECTOMY     SKIN CANCER EXCISION     Dr. Robinson    Family History  Problem Relation Age of Onset   Atrial fibrillation Mother    Stroke Mother        died at 78   Cancer Father        lymphoma   Hypertension Father    Parkinson's disease Father        29   Healthy Brother    Other Brother        died in [place]   Colon cancer Neg Hx    Stomach cancer Neg Hx    Rectal cancer Neg Hx    Esophageal cancer Neg Hx    Social History   Socioeconomic History   Marital status: Married    Spouse name: Not on file   Number of children: Not on file   Years of education: Not on file   Highest education level: Master's degree (e.g., MA, MS, MEng, MEd, MSW, MBA)  Occupational History   Not on file  Tobacco Use   Smoking status: Never   Smokeless tobacco: Never  Vaping Use   Vaping status: Never Used  Substance and Sexual Activity   Alcohol use: Yes    Alcohol/week: 7.0 standard drinks of alcohol    Types: 7 Glasses of wine per week   Drug use: No   Sexual activity: Not on file  Other Topics Concern   Not on file  Social History Narrative   Married. 2 children (40 son with 94 sons, 35 year old married- horse and dogs) in 2017.    Moved to [place] to be near daughter- Lived in [place] prior      Retired [employer]. 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      Hobbies: riding bikes, ski, yoga, workout at [employer], reading, time with mom ([place]- 3 [place] [address])   Social Drivers of Health   Financial Resource Strain: Low Risk  (01/14/2024)   Overall Financial Resource Strain (CARDIA)    Difficulty of Paying Living Expenses: Not hard at all  Food Insecurity: No Food Insecurity (01/14/2024)   Hunger Vital Sign    Worried About Running Out of Food in the Last Year: Never true    Ran Out of Food in the Last Year: Never true  Transportation Needs: No Transportation Needs (01/14/2024)   PRAPARE - Administrator, Civil Service  (Medical): No    Lack of Transportation (Non-Medical): No  Physical Activity: Sufficiently Active (01/14/2024)   Exercise Vital Sign    Days of Exercise per Week: 7 days    Minutes of Exercise per Session: 40 min  Stress: No Stress Concern Present (01/14/2024)   [employer] of Occupational Health - Occupational Stress Questionnaire    Feeling of Stress : Not at all  Social Connections: Moderately Integrated (01/14/2024)  Social Advertising Account Executive [NHANES]    Frequency of Communication with Friends and Family: More than three times a week    Frequency of Social Gatherings with Friends and Family: More than three times a week    Attends Religious Services: More than 4 times per year    Active Member of [employer] or Organizations: No    Attends Engineer, Structural: Never    Marital Status: Married    Tobacco Counseling Counseling given: Not Answered   Clinical Intake:  Pre-visit preparation completed: Yes  Pain : No/denies pain     BMI - recorded: 23.66 Nutritional Status: BMI of 19-24  Normal Diabetes: No  How often do you need to have someone help you when you read instructions, pamphlets, or other written materials from your doctor or pharmacy?: 1 - Never  Interpreter Needed?: No  Information entered by :: Ellouise Haws, LPN   Activities of Daily Living    01/12/2024    4:14 PM  In your present state of health, do you have any difficulty performing the following activities:  Hearing? 0  Vision? 0  Difficulty concentrating or making decisions? 0  Walking or climbing stairs? 0  Dressing or bathing? 0  Doing errands, shopping? 0  Preparing Food and eating ? N  Using the Toilet? N  In the past six months, have you accidently leaked urine? N  Do you have problems with loss of bowel control? N  Managing your Medications? N  Managing your Finances? N  Housekeeping or managing your Housekeeping? N    Patient Care Team: Katrinka Garnette KIDD, MD as  PCP - General (Family Medicine) Wonda Sharper, MD as PCP - Cardiology (Cardiology) Mat Browning, MD as Consulting Physician (Obstetrics and Gynecology) Robinson Idol, MD as Consulting Physician (Ophthalmology) Aneita Gwendlyn DASEN, MD (Inactive) as Consulting Physician (Gastroenterology)  Indicate any recent Medical Services you may have received from other than [facility] providers in the past year (date may be approximate).     Assessment:   This is a routine wellness examination for Jin.  Hearing/Vision screen Hearing Screening - Comments:: Pt denies any hearing issues  Vision Screening - Comments:: Pt follows up with Dr Thyra for annual eye exams    Goals Addressed             This Visit's Progress    Patient Stated       Maintain health and activity        Depression Screen    01/14/2024   11:12 AM 11/26/2023    9:02 AM 10/11/2023    2:53 PM 12/21/2022   11:36 AM 12/14/2021    9:34 AM 09/12/2021    1:24 PM 05/19/2020    1:10 PM  PHQ 2/9 Scores  PHQ - 2 Score 0 0 0 0 0 0 0  PHQ- 9 Score 0 0 0        Fall Risk    01/12/2024    4:14 PM 11/26/2023    9:02 AM 10/11/2023    2:53 PM 12/17/2022    1:22 PM 12/14/2021    9:37 AM  Fall Risk   Falls in the past year? 1 0 0 0 0  Number falls in past yr: 0 0 0 0 0  Injury with Fall? 0 0 0 0 0  Risk for fall due to : History of fall(s) No Fall Risks No Fall Risks Impaired vision   Follow up Falls prevention discussed Falls evaluation completed Falls evaluation  completed Falls prevention discussed     MEDICARE RISK AT HOME: Medicare Risk at Home Any stairs in or around the home?: (Patient-Rptd) Yes If so, are there any without handrails?: (Patient-Rptd) No Home free of loose throw rugs in walkways, pet beds, electrical cords, etc?: (Patient-Rptd) Yes Adequate lighting in your home to reduce risk of falls?: (Patient-Rptd) Yes Life alert?: (Patient-Rptd) No Use of a cane, walker or w/c?: (Patient-Rptd) No Grab bars in the  bathroom?: (Patient-Rptd) No Shower chair or bench in shower?: (Patient-Rptd) No Elevated toilet seat or a handicapped toilet?: (Patient-Rptd) No  TIMED UP AND GO:  Was the test performed?  No    Cognitive Function:        01/14/2024   11:14 AM 12/21/2022   11:39 AM 12/14/2021    9:39 AM  6CIT Screen  What Year? 0 points 0 points 0 points  What month? 0 points 0 points 0 points  What time? 0 points 0 points 0 points  Count back from 20 0 points 0 points 0 points  Months in reverse 0 points 0 points 0 points  Repeat phrase 0 points 0 points 0 points  Total Score 0 points 0 points 0 points    Immunizations Immunization History  Administered Date(s) Administered   Fluad Quad(high Dose 65+) 08/17/2021, 09/13/2022   Influenza, High Dose Seasonal PF 08/25/2023   Influenza-Unspecified 08/10/2014, 09/08/2014, 10/04/2015, 09/26/2016, 09/25/2017, 09/28/2019, 09/28/2020   Moderna SARS-COV2 Booster Vaccination 10/07/2020, 07/10/2021   Moderna Sars-Covid-2 Vaccination 12/17/2019, 01/14/2020   PFIZER Comirnaty(Gray Top)Covid-19 Tri-Sucrose Vaccine 09/06/2022   Pneumococcal Conjugate-13 04/28/2014   Pneumococcal Polysaccharide-23 05/19/2020   RSV,unspecified 11/11/2022   Td 12/10/2008, 03/24/2019   Tdap 04/14/2019   Zoster Recombinant(Shingrix) 09/03/2017, 02/21/2018    TDAP status: Up to date  Flu Vaccine status: Up to date  Pneumococcal vaccine status: Up to date  Covid-19 vaccine status: Information provided on how to obtain vaccines.   Qualifies for Shingles Vaccine? Yes   Zostavax completed Yes   Shingrix Completed?: Yes  Screening Tests Health Maintenance  Topic Date Due   COVID-19 Vaccine (4 - 2024-25 season) 08/11/2023   Medicare Annual Wellness (AWV)  01/13/2025   Colonoscopy  06/22/2027   DTaP/Tdap/Td (4 - Td or Tdap) 04/13/2029   Pneumonia Vaccine 73+ Years old  Completed   INFLUENZA VACCINE  Completed   DEXA SCAN  Completed   Zoster Vaccines- Shingrix   Completed   Hepatitis C Screening  Addressed   HPV VACCINES  Aged Out    Health Maintenance  Health Maintenance Due  Topic Date Due   COVID-19 Vaccine (4 - 2024-25 season) 08/11/2023    Colorectal cancer screening: Type of screening: Colonoscopy. Completed 06/21/21. Repeat every 5 years  Mammogram status: Completed 12/20/23. Repeat every year  Bone Density status: Completed 02/26/22. Results reflect: Bone density results: NORMAL. Repeat every 2 years.  Additional Screening:  Hepatitis C Screening:  Completed 02/26/22  Vision Screening: Recommended annual ophthalmology exams for early detection of glaucoma and other disorders of the eye. Is the patient up to date with their annual eye exam?  Yes  Who is the provider or what is the name of the office in which the patient attends annual eye exams? Dr Thyra  If pt is not established with a provider, would they like to be referred to a provider to establish care? No .   Dental Screening: Recommended annual dental exams for proper oral hygiene  Community Resource Referral / Chronic Care Management: CRR required  this visit?  No   CCM required this visit?  No     Plan:     I have personally reviewed and noted the following in the patient's chart:   Medical and social history Use of alcohol, tobacco or illicit drugs  Current medications and supplements including opioid prescriptions. Patient is not currently taking opioid prescriptions. Functional ability and status Nutritional status Physical activity Advanced directives List of other physicians Hospitalizations, surgeries, and ER visits in previous 12 months Vitals Screenings to include cognitive, depression, and falls Referrals and appointments  In addition, I have reviewed and discussed with patient certain preventive protocols, quality metrics, and best practice recommendations. A written personalized care plan for preventive services as well as general preventive  health recommendations were provided to patient.     Ellouise VEAR Haws, LPN   06/13/7973   After Visit Summary: (MyChart) Due to this being a telephonic visit, the after visit summary with patients personalized plan was offered to patient via MyChart   Nurse Notes: none

## 2024-03-27 ENCOUNTER — Other Ambulatory Visit: Payer: Self-pay | Admitting: Cardiology

## 2024-03-31 NOTE — Progress Notes (Signed)
 "     Cardiology Office Note:    Date:  04/01/2024  ID:  Ruth Shields, Ruth Shields [birth year], MRN [id number] PCP: Katrinka Garnette KIDD, MD  [facility] Providers Cardiologist:  Ozell Fell, MD       Patient Profile:      Coronary artery disease, nonobstructive TTE 08/17/2014: EF 60-65, no RWMA, GR 1 DD, mild AI, mild MR Stress TTE 05/22/2017 [facility] [place] EF 60-65, mild AI, trivial MR, anteroseptal, anterior apical and distal lateral ischemia LHC 05/28/2017: LAD irregularities Ascending thoracic aortic aneurysm Chest MRA 10/30/2023: Ascending thoracic aorta 42 mm>> repeat 2 years Hypertension  Hyperlipidemia   PVCs          Discussed the use of AI scribe software for clinical note transcription with the patient, who gave verbal consent to proceed.  History of Present Illness Ruth Shields is a 78 y.o. female who returns for follow-up of CAD, ascending thoracic aortic aneurysm.  She was last seen in April 2024.  Recent chest MRA in November 2024 demonstrated stable ascending thoracic aorta diameter 42 mm.  She is here alone. She experiences occasional chest discomfort, described as 'really bad heart pain,' occurring three to four times a year, which is relieved by nitroglycerin . The pain is not associated with exertion, diaphoresis, or nausea, and there has been no significant change in the pattern over the years. No exertional symptoms, shortness of breath, or syncope. No pattern to the chest pain episodes and no associated symptoms like nausea or diaphoresis. She has recently moved to [place] and plans to establish care with a new primary care provider there.   ROS-See HPI     Studies Reviewed:   EKG Interpretation Date/Time:  Wednesday April 01 2024 10:22:12 EDT Ventricular Rate:  64 PR Interval:  158 QRS Duration:  78 QT Interval:  404 QTC Calculation: 416 R Axis:   0  Text Interpretation: Normal sinus rhythm Low voltage QRS Septal infarct No significant  change was found Confirmed by Lelon Hamilton [phone]) on 04/01/2024 10:48:51 AM   Results Labs-chart reviewed 10/11/2023: Total cholesterol 165, HDL 88, LDL 64, triglycerides 47, K 3.7, creatinine 0.88, ALT 29    Risk Assessment/Calculations:             Physical Exam:   VS:  BP 110/60   Pulse 64   Ht 5' 5 (1.651 m)   Wt 140 lb 3.2 oz (63.6 kg)   SpO2 98%   BMI 23.33 kg/m    Wt Readings from Last 3 Encounters:  04/01/24 140 lb 3.2 oz (63.6 kg)  01/14/24 140 lb (63.5 kg)  11/26/23 148 lb 9.6 oz (67.4 kg)    Constitutional:      Appearance: Healthy appearance. Not in distress.  Neck:     Vascular: No carotid bruit. JVD normal.  Pulmonary:     Breath sounds: Normal breath sounds. No wheezing. No rales.  Cardiovascular:     Normal rate. Regular rhythm.     Murmurs: There is no murmur.  Edema:    Peripheral edema absent.  Abdominal:     Palpations: Abdomen is soft.         Assessment and Plan:   Assessment & Plan Coronary artery disease involving native coronary artery of native heart without angina pectoris Minimal plaque noted on cardiac catheterization in June 2018. She experiences intermittent chest discomfort approximately 3-4 times annually, relieved by nitroglycerin . The pattern remains unchanged over the years without significant change.  She  has not had exertional symptoms.  Symptoms may be due to coronary vasospasm versus esophageal spasm. Given the stable pattern and absence of new symptoms, no additional testing is necessary at this time. - Continue nitroglycerin  as needed for chest discomfort. She is up to date on refills. Aneurysm of ascending aorta without rupture [employer]) Ascending thoracic aortic aneurysm measured at 42 mm by chest MRA in November 2024.  Blood pressure is well-controlled. - Continue every other year surveillance with MRA. Essential hypertension Hypertension is well controlled on current therapy. Pure hypercholesterolemia Hyperlipidemia is well  controlled with LDL at optimal levels in November 2024.     Dispo:  Return for Follow up as needed. She will establish with someone in [place]..  Signed, Glendia Ferrier, PA-C   "

## 2024-04-01 ENCOUNTER — Ambulatory Visit: Payer: Medicare Other | Attending: Physician Assistant | Admitting: Physician Assistant

## 2024-04-01 ENCOUNTER — Encounter: Payer: Self-pay | Admitting: Physician Assistant

## 2024-04-01 VITALS — BP 110/60 | HR 64 | Ht 65.0 in | Wt 140.2 lb

## 2024-04-01 DIAGNOSIS — I10 Essential (primary) hypertension: Secondary | ICD-10-CM | POA: Insufficient documentation

## 2024-04-01 DIAGNOSIS — I7121 Aneurysm of the ascending aorta, without rupture: Secondary | ICD-10-CM | POA: Insufficient documentation

## 2024-04-01 DIAGNOSIS — I2510 Atherosclerotic heart disease of native coronary artery without angina pectoris: Secondary | ICD-10-CM | POA: Insufficient documentation

## 2024-04-01 DIAGNOSIS — E7800 Pure hypercholesterolemia, unspecified: Secondary | ICD-10-CM | POA: Diagnosis present

## 2024-04-01 NOTE — Assessment & Plan Note (Signed)
 Hyperlipidemia is well controlled with LDL at optimal levels in November 2024.

## 2024-04-01 NOTE — Assessment & Plan Note (Signed)
 Minimal plaque noted on cardiac catheterization in June 2018. She experiences intermittent chest discomfort approximately 3-4 times annually, relieved by nitroglycerin . The pattern remains unchanged over the years without significant change.  She has not had exertional symptoms.  Symptoms may be due to coronary vasospasm versus esophageal spasm. Given the stable pattern and absence of new symptoms, no additional testing is necessary at this time. - Continue nitroglycerin  as needed for chest discomfort. She is up to date on refills.

## 2024-04-01 NOTE — Assessment & Plan Note (Signed)
 Hypertension is well controlled on current therapy.

## 2024-04-01 NOTE — Assessment & Plan Note (Signed)
 Ascending thoracic aortic aneurysm measured at 42 mm by chest MRA in November 2024.  Blood pressure is well-controlled. - Continue every other year surveillance with MRA.

## 2024-04-01 NOTE — Patient Instructions (Signed)
 Medication Instructions:  Your physician recommends that you continue on your current medications as directed. Please refer to the Current Medication list given to you today.  *If you need a refill on your cardiac medications before your next appointment, please call your pharmacy*  Lab Work: None ordered  If you have labs (blood work) drawn today and your tests are completely normal, you will receive your results only by: MyChart Message (if you have MyChart) OR A paper copy in the mail If you have any lab test that is abnormal or we need to change your treatment, we will call you to review the results.  Testing/Procedures: None ordered  Follow-Up: At [facility], you and your health needs are our priority.  As part of our continuing mission to provide you with exceptional heart care, our providers are all part of one team.  This team includes your primary Cardiologist (physician) and Advanced Practice Providers or APPs (Physician Assistants and Nurse Practitioners) who all work together to provide you with the care you need, when you need it.  Your next appointment:   As needed  Provider:   Arnoldo Lapping, MD  or Marlyse Single, PA-C         We recommend signing up for the patient portal called "MyChart".  Sign up information is provided on this After Visit Summary.  MyChart is used to connect with patients for Virtual Visits (Telemedicine).  Patients are able to view lab/test results, encounter notes, upcoming appointments, etc.  Non-urgent messages can be sent to your provider as well.   To learn more about what you can do with MyChart, go to [email or web address].   Other Instructions  If you need a cardiologist in [place], try to get in to [facility].  Dr. Cardell Chang is a cardiologist that practiced with us  for years and he is now in [place]. If you can see him, that would be great.       1st Floor: - Lobby - Registration  - Pharmacy  - Lab -  Cafe  2nd Floor: - PV Lab - Diagnostic Testing (echo, CT, nuclear med)  3rd Floor: - Vacant  4th Floor: - TCTS (cardiothoracic surgery) - AFib Clinic - Structural Heart Clinic - Vascular Surgery  - Vascular Ultrasound  5th Floor: - HeartCare Cardiology (general and EP) - Clinical Pharmacy for coumadin, hypertension, lipid, weight-loss medications, and med management appointments    Valet parking services will be available as well.

## 2024-11-09 ENCOUNTER — Other Ambulatory Visit: Payer: Self-pay | Admitting: Family Medicine
